# Patient Record
Sex: Female | Born: 1975
Health system: Southern US, Community
[De-identification: ages and names within clinical notes are randomized; demographics above are authoritative.]

## PROBLEM LIST (undated history)

## (undated) DIAGNOSIS — J039 Acute tonsillitis, unspecified: Secondary | ICD-10-CM

## (undated) DIAGNOSIS — R011 Cardiac murmur, unspecified: Secondary | ICD-10-CM

## (undated) DIAGNOSIS — E119 Type 2 diabetes mellitus without complications: Secondary | ICD-10-CM

## (undated) DIAGNOSIS — J45909 Unspecified asthma, uncomplicated: Secondary | ICD-10-CM

## (undated) DIAGNOSIS — G43909 Migraine, unspecified, not intractable, without status migrainosus: Secondary | ICD-10-CM

## (undated) DIAGNOSIS — D219 Benign neoplasm of connective and other soft tissue, unspecified: Secondary | ICD-10-CM

## (undated) HISTORY — PX: OTHER SURGICAL HISTORY: SHX169

---

## 2006-05-20 ENCOUNTER — Emergency Department (HOSPITAL_COMMUNITY): Admission: EM | Admit: 2006-05-20 | Discharge: 2006-05-20 | Payer: Self-pay | Admitting: Emergency Medicine

## 2009-01-04 ENCOUNTER — Emergency Department (HOSPITAL_BASED_OUTPATIENT_CLINIC_OR_DEPARTMENT_OTHER): Admission: EM | Admit: 2009-01-04 | Discharge: 2009-01-05 | Payer: Self-pay | Admitting: Emergency Medicine

## 2009-09-03 ENCOUNTER — Emergency Department (HOSPITAL_BASED_OUTPATIENT_CLINIC_OR_DEPARTMENT_OTHER): Admission: EM | Admit: 2009-09-03 | Discharge: 2009-09-03 | Payer: Self-pay | Admitting: Emergency Medicine

## 2009-09-03 ENCOUNTER — Ambulatory Visit: Payer: Self-pay | Admitting: Diagnostic Radiology

## 2010-04-01 ENCOUNTER — Emergency Department (HOSPITAL_BASED_OUTPATIENT_CLINIC_OR_DEPARTMENT_OTHER): Admission: EM | Admit: 2010-04-01 | Discharge: 2010-04-01 | Payer: Self-pay | Admitting: Emergency Medicine

## 2010-09-25 ENCOUNTER — Emergency Department (HOSPITAL_BASED_OUTPATIENT_CLINIC_OR_DEPARTMENT_OTHER)
Admission: EM | Admit: 2010-09-25 | Discharge: 2010-09-25 | Payer: Self-pay | Source: Home / Self Care | Admitting: Emergency Medicine

## 2010-09-25 LAB — RAPID STREP SCREEN (MED CTR MEBANE ONLY): Streptococcus, Group A Screen (Direct): NEGATIVE

## 2010-11-15 LAB — CBC
HCT: 30.6 % — ABNORMAL LOW (ref 36.0–46.0)
Hemoglobin: 10.4 g/dL — ABNORMAL LOW (ref 12.0–15.0)
MCHC: 33.9 g/dL (ref 30.0–36.0)
MCV: 84.1 fL (ref 78.0–100.0)
Platelets: 204 10*3/uL (ref 150–400)
RBC: 3.65 MIL/uL — ABNORMAL LOW (ref 3.87–5.11)
RDW: 14.8 % (ref 11.5–15.5)
WBC: 9.1 10*3/uL (ref 4.0–10.5)

## 2010-11-15 LAB — DIFFERENTIAL
Basophils Absolute: 0 10*3/uL (ref 0.0–0.1)
Basophils Relative: 0 % (ref 0–1)
Eosinophils Absolute: 0.1 10*3/uL (ref 0.0–0.7)
Eosinophils Relative: 1 % (ref 0–5)
Lymphocytes Relative: 38 % (ref 12–46)
Lymphs Abs: 3.5 10*3/uL (ref 0.7–4.0)
Monocytes Absolute: 0.7 10*3/uL (ref 0.1–1.0)
Monocytes Relative: 8 % (ref 3–12)
Neutro Abs: 4.8 10*3/uL (ref 1.7–7.7)
Neutrophils Relative %: 53 % (ref 43–77)

## 2010-11-15 LAB — URINALYSIS, ROUTINE W REFLEX MICROSCOPIC
Bilirubin Urine: NEGATIVE
Glucose, UA: NEGATIVE mg/dL
Hgb urine dipstick: NEGATIVE
Ketones, ur: NEGATIVE mg/dL
Nitrite: NEGATIVE
Protein, ur: NEGATIVE mg/dL
Specific Gravity, Urine: 1.026 (ref 1.005–1.030)
Urobilinogen, UA: 0.2 mg/dL (ref 0.0–1.0)
pH: 6.5 (ref 5.0–8.0)

## 2010-11-15 LAB — POCT CARDIAC MARKERS
CKMB, poc: <1 ng/mL — ABNORMAL LOW (ref 1.0–8.0)
CKMB, poc: <1 ng/mL — ABNORMAL LOW (ref 1.0–8.0)
Myoglobin, poc: 44.2 ng/mL (ref 12–200)
Myoglobin, poc: 50.9 ng/mL (ref 12–200)
Troponin i, poc: <0.05 ng/mL (ref 0.00–0.09)
Troponin i, poc: <0.05 ng/mL (ref 0.00–0.09)

## 2010-11-15 LAB — BASIC METABOLIC PANEL
BUN: 11 mg/dL (ref 6–23)
CO2: 28 mEq/L (ref 19–32)
Calcium: 9.2 mg/dL (ref 8.4–10.5)
Chloride: 103 mEq/L (ref 96–112)
Creatinine, Ser: 0.7 mg/dL (ref 0.4–1.2)
GFR calc Af Amer: >60 mL/min (ref >60)
GFR calc non Af Amer: >60 mL/min (ref >60)
Glucose, Bld: 89 mg/dL (ref 70–99)
Potassium: 4 mEq/L (ref 3.5–5.1)
Sodium: 142 mEq/L (ref 135–145)

## 2010-11-15 LAB — D-DIMER, QUANTITATIVE: D-Dimer, Quant: 0.41 ug/mL-FEU (ref 0.00–0.48)

## 2010-11-15 LAB — PREGNANCY, URINE: Preg Test, Ur: NEGATIVE

## 2010-12-08 LAB — URINALYSIS, ROUTINE W REFLEX MICROSCOPIC
Bilirubin Urine: NEGATIVE
Glucose, UA: NEGATIVE mg/dL
Hgb urine dipstick: NEGATIVE
Ketones, ur: 15 mg/dL — AB
Nitrite: NEGATIVE
Protein, ur: NEGATIVE mg/dL
Specific Gravity, Urine: 1.033 — ABNORMAL HIGH (ref 1.005–1.030)
Urobilinogen, UA: 1 mg/dL (ref 0.0–1.0)
pH: 6 (ref 5.0–8.0)

## 2010-12-08 LAB — PREGNANCY, URINE: Preg Test, Ur: NEGATIVE

## 2011-06-27 ENCOUNTER — Encounter: Payer: Self-pay | Admitting: Emergency Medicine

## 2011-06-27 ENCOUNTER — Emergency Department (HOSPITAL_BASED_OUTPATIENT_CLINIC_OR_DEPARTMENT_OTHER)
Admission: EM | Admit: 2011-06-27 | Discharge: 2011-06-27 | Disposition: A | Discharge status: Home / Self Care | Payer: Self-pay | Attending: Emergency Medicine | Admitting: Emergency Medicine

## 2011-06-27 DIAGNOSIS — K2970 Gastritis, unspecified, without bleeding: Secondary | ICD-10-CM | POA: Insufficient documentation

## 2011-06-27 DIAGNOSIS — R1013 Epigastric pain: Secondary | ICD-10-CM | POA: Insufficient documentation

## 2011-06-27 LAB — COMPREHENSIVE METABOLIC PANEL
ALT: 8 U/L (ref 0–35)
AST: 14 U/L (ref 0–37)
Albumin: 4.1 g/dL (ref 3.5–5.2)
Alkaline Phosphatase: 65 U/L (ref 39–117)
BUN: 9 mg/dL (ref 6–23)
CO2: 29 mEq/L (ref 19–32)
Calcium: 9.5 mg/dL (ref 8.4–10.5)
Chloride: 102 mEq/L (ref 96–112)
Creatinine, Ser: 0.7 mg/dL (ref 0.50–1.10)
GFR calc Af Amer: >90 mL/min (ref >90)
GFR calc non Af Amer: >90 mL/min (ref >90)
Glucose, Bld: 91 mg/dL (ref 70–99)
Potassium: 4.1 mEq/L (ref 3.5–5.1)
Sodium: 138 mEq/L (ref 135–145)
Total Bilirubin: 0.4 mg/dL (ref 0.3–1.2)
Total Protein: 7.9 g/dL (ref 6.0–8.3)

## 2011-06-27 LAB — DIFFERENTIAL
Basophils Absolute: 0 10*3/uL (ref 0.0–0.1)
Basophils Relative: 1 % (ref 0–1)
Eosinophils Absolute: 0.1 10*3/uL (ref 0.0–0.7)
Eosinophils Relative: 1 % (ref 0–5)
Lymphocytes Relative: 42 % (ref 12–46)
Lymphs Abs: 1.8 10*3/uL (ref 0.7–4.0)
Monocytes Absolute: 0.5 10*3/uL (ref 0.1–1.0)
Monocytes Relative: 11 % (ref 3–12)
Neutro Abs: 2 10*3/uL (ref 1.7–7.7)
Neutrophils Relative %: 46 % (ref 43–77)

## 2011-06-27 LAB — PREGNANCY, URINE: Preg Test, Ur: NEGATIVE

## 2011-06-27 LAB — URINALYSIS, ROUTINE W REFLEX MICROSCOPIC
Bilirubin Urine: NEGATIVE
Glucose, UA: NEGATIVE mg/dL
Hgb urine dipstick: NEGATIVE
Ketones, ur: 15 mg/dL — AB
Nitrite: NEGATIVE
Protein, ur: NEGATIVE mg/dL
Specific Gravity, Urine: 1.018 (ref 1.005–1.030)
Urobilinogen, UA: 0.2 mg/dL (ref 0.0–1.0)
pH: 5.5 (ref 5.0–8.0)

## 2011-06-27 LAB — URINE MICROSCOPIC-ADD ON

## 2011-06-27 LAB — CBC
HCT: 32 % — ABNORMAL LOW (ref 36.0–46.0)
Hemoglobin: 10.3 g/dL — ABNORMAL LOW (ref 12.0–15.0)
MCH: 27.8 pg (ref 26.0–34.0)
MCHC: 32.2 g/dL (ref 30.0–36.0)
MCV: 86.3 fL (ref 78.0–100.0)
Platelets: 283 10*3/uL (ref 150–400)
RBC: 3.71 MIL/uL — ABNORMAL LOW (ref 3.87–5.11)
RDW: 13.4 % (ref 11.5–15.5)
WBC: 4.3 10*3/uL (ref 4.0–10.5)

## 2011-06-27 LAB — LIPASE, BLOOD: Lipase: 36 U/L (ref 11–59)

## 2011-06-27 MED ORDER — GI COCKTAIL ~~LOC~~
30.0000 mL | Freq: Once | ORAL | Status: AC
  Administered 2011-06-27: 30 mL via ORAL
  Filled 2011-06-27: qty 30

## 2011-06-27 MED ORDER — RANITIDINE HCL 150 MG PO TABS: 150.0000 mg | ORAL_TABLET | Freq: Two times a day (BID) | ORAL | Status: DC

## 2011-06-27 NOTE — ED Provider Notes (Signed)
History     CSN: [id number] Arrival date & time: 06/27/2011  9:45 AM   First MD Initiated Contact with Patient 06/27/11 [phone]      Chief Complaint  Patient presents with  . Abdominal Pain    (Consider location/radiation/quality/duration/timing/severity/associated sxs/prior treatment) HPI Patient presents with complaint of epigastric pain. She states pain began 2 days ago and has been constant as well as waxing and waning in nature. She describes the pain as sharp and cramping. She had one episode of emesis the day before the pain began. Has had decreased appetite and decreased by mouth intake due to pain. No diarrhea no further vomiting. No fevers or chills. She has had a history of a stomach ulcer but states this was so long ago that she does not remember if it feels similar to her current pain. Nothing makes the pain worse or better. There are no other associated symptoms.  Past Medical History  Diagnosis Date  . Gastric ulcer    Hx of gastric ulcer per patient  History reviewed. No pertinent past surgical history.  History reviewed. No pertinent family history.  History  Substance Use Topics  . Smoking status: Never Smoker   . Smokeless tobacco: Not on file  . Alcohol Use: Yes     occasional    OB History    Grav Para Term Preterm Abortions TAB SAB Ect Mult Living                  Review of Systems ROS reviewed and otherwise negative except for mentioned in HPI  Allergies  Review of patient's allergies indicates no known allergies.  Home Medications   Current Outpatient Rx  Name Route Sig Dispense Refill  . RANITIDINE HCL 150 MG PO TABS Oral Take 1 tablet (150 mg total) by mouth 2 (two) times daily. 60 tablet 0    BP 114/72  Pulse 62  Temp(Src) 98.6 F (37 C) (Oral)  Resp 16  Ht 5\' 7"  (1.702 m)  Wt 220 lb (99.791 kg)  BMI 34.46 kg/m2  SpO2 99%  LMP 06/20/2011 Vitals reviewed Physical Exam Physical Examination: General appearance - alert, well  appearing, and in no distress Mental status - alert, oriented to person, place, and time Mouth - mucous membranes moist, pharynx normal without lesions Chest - clear to auscultation, no wheezes, rales or rhonchi, symmetric air entry Heart - normal rate, regular rhythm, normal S1, S2, no murmurs, rubs, clicks or gallops Abdomen - ttp in epigastric region, soft, ND, NABS, no gaurding, no rebound Neurological - motor and sensory grossly normal bilaterally Musculoskeletal - no joint tenderness, deformity or swelling Extremities - peripheral pulses normal, no pedal edema, no clubbing or cyanosis Skin - normal coloration and turgor, no rashes, no suspicious skin lesions noted  ED Course  Procedures (including critical care time) Labs, urine ordered.  Will try GI cocktail for discomfort and reassess.  Labs Reviewed  URINALYSIS, ROUTINE W REFLEX MICROSCOPIC - Abnormal; Notable for the following:    Appearance CLOUDY (*)    Ketones, ur 15 (*)    Leukocytes, UA TRACE (*)    All other components within normal limits  CBC - Abnormal; Notable for the following:    RBC 3.71 (*)    Hemoglobin 10.3 (*)    HCT 32.0 (*)    All other components within normal limits  URINE MICROSCOPIC-ADD ON - Abnormal; Notable for the following:    Squamous Epithelial / LPF FEW (*)    Bacteria,  UA MANY (*)    All other components within normal limits  PREGNANCY, URINE  DIFFERENTIAL  COMPREHENSIVE METABOLIC PANEL  LIPASE, BLOOD  URINE CULTURE   No results found. 11:45 AM Labs results were reassuring.  Urine shows many bacteria, but patient has no symtpoms of UTI, so urine culture obtained.    1. Gastritis       MDM  Patient presenting with epigastric pain. Blood work was reassuring and did not reveal any concerning findings to indicate gallbladder pathology or pancreatitis et Karie Soda. Urine did show many bacteria however patient does not have any symptoms of urinary tract infections a urine culture was sent.  Pain was improved after GI cocktail. I suspect pain is related to gastritis or reflux. Will start patient on Zantac and she was advised to follow up with her primary doctor. She is given strict return precautions and she is agreeable with this plan.        Ethelda Chick, MD 06/27/11 [phone]

## 2011-06-27 NOTE — ED Notes (Signed)
Upper abdominal/epigastric pain x 2 days.  Some nausea and vomiting x 1 prior to pain.  No known fever.  No diarrhea.  Some heartburn.

## 2011-06-29 LAB — URINE CULTURE
Colony Count: NO GROWTH
Culture  Setup Time: 201210290323
Culture: NO GROWTH

## 2012-08-01 ENCOUNTER — Encounter (HOSPITAL_BASED_OUTPATIENT_CLINIC_OR_DEPARTMENT_OTHER): Payer: Self-pay

## 2012-08-01 ENCOUNTER — Emergency Department (HOSPITAL_BASED_OUTPATIENT_CLINIC_OR_DEPARTMENT_OTHER)
Admission: EM | Admit: 2012-08-01 | Discharge: 2012-08-01 | Disposition: A | Discharge status: Home / Self Care | Payer: Self-pay | Attending: Emergency Medicine | Admitting: Emergency Medicine

## 2012-08-01 DIAGNOSIS — R1110 Vomiting, unspecified: Secondary | ICD-10-CM | POA: Insufficient documentation

## 2012-08-01 DIAGNOSIS — R51 Headache: Secondary | ICD-10-CM | POA: Insufficient documentation

## 2012-08-01 DIAGNOSIS — Z79899 Other long term (current) drug therapy: Secondary | ICD-10-CM | POA: Insufficient documentation

## 2012-08-01 DIAGNOSIS — Z8679 Personal history of other diseases of the circulatory system: Secondary | ICD-10-CM | POA: Insufficient documentation

## 2012-08-01 DIAGNOSIS — K219 Gastro-esophageal reflux disease without esophagitis: Secondary | ICD-10-CM | POA: Insufficient documentation

## 2012-08-01 HISTORY — DX: Migraine, unspecified, not intractable, without status migrainosus: G43.909

## 2012-08-01 MED ORDER — DIPHENHYDRAMINE HCL 50 MG/ML IJ SOLN
25.0000 mg | Freq: Once | INTRAMUSCULAR | Status: AC
  Administered 2012-08-01: 25 mg via INTRAMUSCULAR
  Filled 2012-08-01: qty 1

## 2012-08-01 MED ORDER — BUTALBITAL-APAP-CAFFEINE 50-325-40 MG PO TABS: 1.0000 | ORAL_TABLET | Freq: Four times a day (QID) | ORAL | Status: AC | PRN

## 2012-08-01 MED ORDER — METOCLOPRAMIDE HCL 5 MG/ML IJ SOLN
10.0000 mg | Freq: Once | INTRAMUSCULAR | Status: AC
  Administered 2012-08-01: 10 mg via INTRAMUSCULAR
  Filled 2012-08-01: qty 2

## 2012-08-01 MED ORDER — KETOROLAC TROMETHAMINE 60 MG/2ML IM SOLN
60.0000 mg | Freq: Once | INTRAMUSCULAR | Status: AC
  Administered 2012-08-01: 60 mg via INTRAMUSCULAR
  Filled 2012-08-01: qty 2

## 2012-08-01 NOTE — ED Notes (Signed)
Karen Sofia, PA-C at bedside 

## 2012-08-01 NOTE — ED Provider Notes (Signed)
History     CSN: [id number]  Arrival date & time 08/01/12  1237   First MD Initiated Contact with Patient 08/01/12 1247      Chief Complaint  Patient presents with  . Headache  . Chest Pain    (Consider location/radiation/quality/duration/timing/severity/associated sxs/prior treatment) Patient is a 36 y.o. female presenting with headaches. The history is provided by the patient. No language interpreter was used.  Headache  This is a new problem. The current episode started yesterday. The problem occurs constantly. The problem has been gradually worsening. The headache is associated with nothing. The pain is located in the frontal region. The quality of the pain is described as sharp. The pain is at a severity of 5/10. The pain is moderate. The pain does not radiate. Associated symptoms include vomiting. She has tried nothing for the symptoms. The treatment provided no relief.   Pt complains of a headache since yesterday.  Pt also worried about her blood pressure Past Medical History  Diagnosis Date  . Gastric ulcer   . Migraines     History reviewed. No pertinent past surgical history.  No family history on file.  History  Substance Use Topics  . Smoking status: Never Smoker   . Smokeless tobacco: Not on file  . Alcohol Use: No    OB History    Grav Para Term Preterm Abortions TAB SAB Ect Mult Living                  Review of Systems  Gastrointestinal: Positive for vomiting.  Neurological: Positive for headaches.  All other systems reviewed and are negative.    Allergies  Review of patient's allergies indicates no known allergies.  Home Medications   Current Outpatient Rx  Name  Route  Sig  Dispense  Refill  . RANITIDINE HCL 150 MG PO TABS   Oral   Take 1 tablet (150 mg total) by mouth 2 (two) times daily.   60 tablet   0     BP 139/89  Pulse 71  Temp 98.7 F (37.1 C) (Oral)  Resp 16  Ht 5\' 7"  (1.702 m)  Wt 205 lb (92.987 kg)  BMI 32.11 kg/m2   SpO2 99%  Physical Exam  Constitutional: She is oriented to person, place, and time. She appears well-developed and well-nourished.  HENT:  Head: Normocephalic and atraumatic.  Right Ear: External ear normal.  Left Ear: External ear normal.  Nose: Nose normal.  Mouth/Throat: Oropharynx is clear and moist.  Eyes: Conjunctivae normal and EOM are normal. Pupils are equal, round, and reactive to light.  Neck: Normal range of motion.  Cardiovascular: Normal rate and normal heart sounds.   Pulmonary/Chest: Effort normal and breath sounds normal.  Abdominal: Soft. Bowel sounds are normal.  Musculoskeletal: Normal range of motion.  Neurological: She is alert and oriented to person, place, and time. She has normal reflexes.  Skin: Skin is warm and dry.  Psychiatric: She has a normal mood and affect.    ED Course  Procedures (including critical care time)  Labs Reviewed - No data to display No results found.   No diagnosis found.    MDM  Pt given torodol, benadryl and reglan.   Pt reports she feels better.   Pt advised to have monitor blood pressure       Lonia Skinner [place], [place] 08/01/12 1432

## 2012-08-01 NOTE — ED Notes (Signed)
Pt reports onset of chest tightness last night while at rest.  She also reports a headache.

## 2012-08-04 NOTE — ED Provider Notes (Signed)
Medical screening examination/treatment/procedure(s) were performed by non-physician practitioner and as supervising physician I was immediately available for consultation/collaboration.   Suzi Roots, MD 08/04/12 [phone]

## 2013-07-11 ENCOUNTER — Ambulatory Visit: Payer: Self-pay | Admitting: Internal Medicine

## 2013-09-19 ENCOUNTER — Ambulatory Visit: Payer: Self-pay | Admitting: Internal Medicine

## 2013-10-24 ENCOUNTER — Ambulatory Visit: Payer: Self-pay | Admitting: Internal Medicine

## 2013-12-17 ENCOUNTER — Ambulatory Visit: Payer: Self-pay | Admitting: Internal Medicine

## 2014-02-10 ENCOUNTER — Emergency Department (HOSPITAL_BASED_OUTPATIENT_CLINIC_OR_DEPARTMENT_OTHER): Payer: Self-pay

## 2014-02-10 ENCOUNTER — Emergency Department (HOSPITAL_BASED_OUTPATIENT_CLINIC_OR_DEPARTMENT_OTHER)
Admission: EM | Admit: 2014-02-10 | Discharge: 2014-02-11 | Disposition: A | Discharge status: Home / Self Care | Payer: Self-pay | Attending: Emergency Medicine | Admitting: Emergency Medicine

## 2014-02-10 ENCOUNTER — Encounter (HOSPITAL_BASED_OUTPATIENT_CLINIC_OR_DEPARTMENT_OTHER): Payer: Self-pay | Admitting: Emergency Medicine

## 2014-02-10 DIAGNOSIS — R102 Pelvic and perineal pain: Secondary | ICD-10-CM

## 2014-02-10 DIAGNOSIS — Z3202 Encounter for pregnancy test, result negative: Secondary | ICD-10-CM | POA: Insufficient documentation

## 2014-02-10 DIAGNOSIS — Z79899 Other long term (current) drug therapy: Secondary | ICD-10-CM | POA: Insufficient documentation

## 2014-02-10 DIAGNOSIS — Z8742 Personal history of other diseases of the female genital tract: Secondary | ICD-10-CM | POA: Insufficient documentation

## 2014-02-10 DIAGNOSIS — K259 Gastric ulcer, unspecified as acute or chronic, without hemorrhage or perforation: Secondary | ICD-10-CM | POA: Insufficient documentation

## 2014-02-10 DIAGNOSIS — N83209 Unspecified ovarian cyst, unspecified side: Secondary | ICD-10-CM | POA: Insufficient documentation

## 2014-02-10 DIAGNOSIS — Z8679 Personal history of other diseases of the circulatory system: Secondary | ICD-10-CM | POA: Insufficient documentation

## 2014-02-10 HISTORY — DX: Benign neoplasm of connective and other soft tissue, unspecified: D21.9

## 2014-02-10 LAB — CBC WITH DIFFERENTIAL/PLATELET
Basophils Absolute: 0 10*3/uL (ref 0.0–0.1)
Basophils Relative: 0 % (ref 0–1)
Eosinophils Absolute: 0.1 10*3/uL (ref 0.0–0.7)
Eosinophils Relative: 2 % (ref 0–5)
HCT: 30.9 % — ABNORMAL LOW (ref 36.0–46.0)
Hemoglobin: 9.8 g/dL — ABNORMAL LOW (ref 12.0–15.0)
Lymphocytes Relative: 33 % (ref 12–46)
Lymphs Abs: 2.9 10*3/uL (ref 0.7–4.0)
MCH: 25.7 pg — ABNORMAL LOW (ref 26.0–34.0)
MCHC: 31.7 g/dL (ref 30.0–36.0)
MCV: 81.1 fL (ref 78.0–100.0)
Monocytes Absolute: 0.6 10*3/uL (ref 0.1–1.0)
Monocytes Relative: 7 % (ref 3–12)
Neutro Abs: 5.3 10*3/uL (ref 1.7–7.7)
Neutrophils Relative %: 59 % (ref 43–77)
Platelets: 332 10*3/uL (ref 150–400)
RBC: 3.81 MIL/uL — ABNORMAL LOW (ref 3.87–5.11)
RDW: 15.8 % — ABNORMAL HIGH (ref 11.5–15.5)
WBC: 8.9 10*3/uL (ref 4.0–10.5)

## 2014-02-10 LAB — COMPREHENSIVE METABOLIC PANEL
ALT: 9 U/L (ref 0–35)
AST: 15 U/L (ref 0–37)
Albumin: 3.8 g/dL (ref 3.5–5.2)
Alkaline Phosphatase: 61 U/L (ref 39–117)
BUN: 12 mg/dL (ref 6–23)
CO2: 27 mEq/L (ref 19–32)
Calcium: 9.6 mg/dL (ref 8.4–10.5)
Chloride: 100 mEq/L (ref 96–112)
Creatinine, Ser: 0.9 mg/dL (ref 0.50–1.10)
GFR calc Af Amer: >90 mL/min (ref >90)
GFR calc non Af Amer: 81 mL/min — ABNORMAL LOW (ref >90)
Glucose, Bld: 106 mg/dL — ABNORMAL HIGH (ref 70–99)
Potassium: 4.5 mEq/L (ref 3.7–5.3)
Sodium: 138 mEq/L (ref 137–147)
Total Bilirubin: <0.2 mg/dL — ABNORMAL LOW (ref 0.3–1.2)
Total Protein: 7.9 g/dL (ref 6.0–8.3)

## 2014-02-10 LAB — WET PREP, GENITAL
Clue Cells Wet Prep HPF POC: NONE SEEN
Trich, Wet Prep: NONE SEEN
Yeast Wet Prep HPF POC: NONE SEEN

## 2014-02-10 LAB — PREGNANCY, URINE: Preg Test, Ur: NEGATIVE

## 2014-02-10 LAB — URINALYSIS, ROUTINE W REFLEX MICROSCOPIC
Bilirubin Urine: NEGATIVE
Glucose, UA: NEGATIVE mg/dL
Hgb urine dipstick: NEGATIVE
Ketones, ur: NEGATIVE mg/dL
Leukocytes, UA: NEGATIVE
Nitrite: NEGATIVE
Protein, ur: NEGATIVE mg/dL
Specific Gravity, Urine: 1.023 (ref 1.005–1.030)
Urobilinogen, UA: 0.2 mg/dL (ref 0.0–1.0)
pH: 6 (ref 5.0–8.0)

## 2014-02-10 MED ORDER — NAPROXEN 500 MG PO TABS: 500.0000 mg | ORAL_TABLET | Freq: Two times a day (BID) | ORAL | Status: DC

## 2014-02-10 MED ORDER — KETOROLAC TROMETHAMINE 30 MG/ML IJ SOLN
30.0000 mg | Freq: Once | INTRAMUSCULAR | Status: AC
  Administered 2014-02-10: 30 mg via INTRAVENOUS
  Filled 2014-02-10: qty 1

## 2014-02-10 MED ORDER — SODIUM CHLORIDE 0.9 % IV SOLN
INTRAVENOUS | Status: DC
  Administered 2014-02-10: 22:00:00 via INTRAVENOUS

## 2014-02-10 NOTE — ED Provider Notes (Signed)
CSN: [id number]     Arrival date & time 02/10/14  1915 History   First MD Initiated Contact with Patient 02/10/14 2108     Chief Complaint  Patient presents with  . Abdominal Pain     (Consider location/radiation/quality/duration/timing/severity/associated sxs/prior Treatment) Patient is a 38 y.o. female presenting with abdominal pain. The history is provided by the patient.  Abdominal Pain Pain location:  RLQ, suprapubic and RUQ Pain quality: aching   Pain radiates to:  Does not radiate Pain severity:  Moderate Onset quality:  Gradual Duration:  4 days Timing:  Intermittent Progression:  Unchanged Chronicity:  New Relieved by:  None tried Worsened by:  Nothing tried Ineffective treatments:  None tried  Abigail Jacobson is a 38 y.o. female who presents to the ED with pelvic pain that started 4 days ago. Her LMP was one month ago. She has not been sexually active in 6 months. Her last pap smear was one year ago and was normal.  She has taken nothing for pain. She is not taking any birth control. The pain goes across the lower abdomen and is worse on the right side. She denies nausea, vomiting or diarrhea.   Past Medical History  Diagnosis Date  . Gastric ulcer   . Migraines   . Fibroid    History reviewed. No pertinent past surgical history. History reviewed. No pertinent family history. History  Substance Use Topics  . Smoking status: Never Smoker   . Smokeless tobacco: Not on file  . Alcohol Use: No   OB History   Grav Para Term Preterm Abortions TAB SAB Ect Mult Living                 Review of Systems  Genitourinary: Positive for pelvic pain.  All other systems negative    Allergies  Review of patient's allergies indicates no known allergies.  Home Medications   Prior to Admission medications   Medication Sig Start Date End Date Taking? Authorizing Provider  ranitidine (ZANTAC) 150 MG tablet Take 1 tablet (150 mg total) by mouth 2 (two) times daily. 06/27/11  06/26/12  Threasa Beards, MD   BP 158/76  Pulse 70  Temp(Src) 99.1 F (37.3 C) (Oral)  Resp 20  SpO2 99%  LMP 01/10/2014 Physical Exam  Nursing note and vitals reviewed. Constitutional: She is oriented to person, place, and time. She appears well-developed and well-nourished.  HENT:  Head: Normocephalic.  Eyes: EOM are normal.  Neck: Neck supple.  Cardiovascular: Normal rate and regular rhythm.   Pulmonary/Chest: Effort normal and breath sounds normal.  Abdominal: Soft. There is tenderness in the right lower quadrant, suprapubic area and left lower quadrant. There is no rebound, no guarding and no CVA tenderness.  Pain is worse with palpation in the RLQ  Genitourinary:  External genitalia without lesions, white discharge vaginal vault, mild CMT, right adnexal tenderness with fullness palpated.   Musculoskeletal: Normal range of motion.  Neurological: She is alert and oriented to person, place, and time. No cranial nerve deficit.  Skin: Skin is warm and dry.  Psychiatric: She has a normal mood and affect. Her behavior is normal.    ED Course  Procedures Results for orders placed during the hospital encounter of 02/10/14 (from the past 24 hour(s))  URINALYSIS, ROUTINE W REFLEX MICROSCOPIC     Status: None   Collection Time    02/10/14  8:57 PM      Result Value Ref Range   Color, Urine YELLOW  YELLOW   APPearance CLEAR  CLEAR   Specific Gravity, Urine 1.023  1.005 - 1.030   pH 6.0  5.0 - 8.0   Glucose, UA NEGATIVE  NEGATIVE mg/dL   Hgb urine dipstick NEGATIVE  NEGATIVE   Bilirubin Urine NEGATIVE  NEGATIVE   Ketones, ur NEGATIVE  NEGATIVE mg/dL   Protein, ur NEGATIVE  NEGATIVE mg/dL   Urobilinogen, UA 0.2  0.0 - 1.0 mg/dL   Nitrite NEGATIVE  NEGATIVE   Leukocytes, UA NEGATIVE  NEGATIVE  PREGNANCY, URINE     Status: None   Collection Time    02/10/14  8:57 PM      Result Value Ref Range   Preg Test, Ur NEGATIVE  NEGATIVE  CBC WITH DIFFERENTIAL     Status: Abnormal    Collection Time    02/10/14  9:30 PM      Result Value Ref Range   WBC 8.9  4.0 - 10.5 K/uL   RBC 3.81 (*) 3.87 - 5.11 MIL/uL   Hemoglobin 9.8 (*) 12.0 - 15.0 g/dL   HCT 30.9 (*) 36.0 - 46.0 %   MCV 81.1  78.0 - 100.0 fL   MCH 25.7 (*) 26.0 - 34.0 pg   MCHC 31.7  30.0 - 36.0 g/dL   RDW 15.8 (*) 11.5 - 15.5 %   Platelets 332  150 - 400 K/uL   Neutrophils Relative % 59  43 - 77 %   Neutro Abs 5.3  1.7 - 7.7 K/uL   Lymphocytes Relative 33  12 - 46 %   Lymphs Abs 2.9  0.7 - 4.0 K/uL   Monocytes Relative 7  3 - 12 %   Monocytes Absolute 0.6  0.1 - 1.0 K/uL   Eosinophils Relative 2  0 - 5 %   Eosinophils Absolute 0.1  0.0 - 0.7 K/uL   Basophils Relative 0  0 - 1 %   Basophils Absolute 0.0  0.0 - 0.1 K/uL  COMPREHENSIVE METABOLIC PANEL     Status: Abnormal   Collection Time    02/10/14  9:30 PM      Result Value Ref Range   Sodium 138  137 - 147 mEq/L   Potassium 4.5  3.7 - 5.3 mEq/L   Chloride 100  96 - 112 mEq/L   CO2 27  19 - 32 mEq/L   Glucose, Bld 106 (*) 70 - 99 mg/dL   BUN 12  6 - 23 mg/dL   Creatinine, Ser 0.90  0.50 - 1.10 mg/dL   Calcium 9.6  8.4 - 10.5 mg/dL   Total Protein 7.9  6.0 - 8.3 g/dL   Albumin 3.8  3.5 - 5.2 g/dL   AST 15  0 - 37 U/L   ALT 9  0 - 35 U/L   Alkaline Phosphatase 61  39 - 117 U/L   Total Bilirubin <0.2 (*) 0.3 - 1.2 mg/dL   GFR calc non Af Amer 81 (*) >90 mL/min   GFR calc Af Amer >90  >90 mL/min  WET PREP, GENITAL     Status: Abnormal   Collection Time    02/10/14 10:15 PM      Result Value Ref Range   Yeast Wet Prep HPF POC NONE SEEN  NONE SEEN   Trich, Wet Prep NONE SEEN  NONE SEEN   Clue Cells Wet Prep HPF POC NONE SEEN  NONE SEEN   WBC, Wet Prep HPF POC FEW (*) NONE SEEN    US Transvaginal Non-ob  02/10/2014   CLINICAL DATA:  Right lower quadrant abdominal pain.  EXAM: TRANSABDOMINAL AND TRANSVAGINAL ULTRASOUND OF PELVIS  TECHNIQUE: Both transabdominal and transvaginal ultrasound examinations of the pelvis were performed.  Transabdominal technique was performed for global imaging of the pelvis including uterus, ovaries, adnexal regions, and pelvic cul-de-sac. It was necessary to proceed with endovaginal exam following the transabdominal exam to visualize the uterus and ovaries in greater detail.  COMPARISON:  None  FINDINGS: Uterus  Measurements: 5.6 x 6.3 x 7.4 cm. No fibroids or other mass visualized.  Endometrium  Thickness: 2.6 cm.  Diffusely thickened, of uncertain significance.  Right ovary  Measurements: 6.1 x 3.9 x 4.4 cm. There is a 5.3 x 4.5 x 3.9 cm collection at the right ovary, with lace-like structure and internal echoes, compatible with a hemorrhagic cyst.  Left ovary  Measurements: 3.5 x 1.7 x 1.5 cm. Normal appearance/no adnexal mass.  Other findings  There is a complex collection of fluid at the cervix, with a simple cyst component, an apparent hemorrhagic component, and question of a small soft tissue nodule. This measures approximately 3.0 x 2.5 x 1.8 cm.  A small amount of free fluid is seen within the pelvic cul-de-sac.  IMPRESSION: 1. 5.3 cm hemorrhagic cyst noted at the right ovary. 2. Complex collection of fluid at the cervix, with a hemorrhagic component, a simple cyst component, and question of a small soft tissue nodule. This measures approximately 3.0 x 2.5 x 1.8 cm. Further evaluation of the cervix would be helpful, as deemed clinically appropriate. 3. Diffuse endometrial echo complex thickening, of uncertain significance. This may reflect some degree of obstruction from the collection at the cervix. 4. No evidence for ovarian torsion.   Electronically Signed   By: Garald Balding M.D.   On: 02/10/2014 23:23   US Pelvis Complete  02/10/2014   CLINICAL DATA:  Right lower quadrant abdominal pain.  EXAM: TRANSABDOMINAL AND TRANSVAGINAL ULTRASOUND OF PELVIS  TECHNIQUE: Both transabdominal and transvaginal ultrasound examinations of the pelvis were performed. Transabdominal technique was performed for global  imaging of the pelvis including uterus, ovaries, adnexal regions, and pelvic cul-de-sac. It was necessary to proceed with endovaginal exam following the transabdominal exam to visualize the uterus and ovaries in greater detail.  COMPARISON:  None  FINDINGS: Uterus  Measurements: 5.6 x 6.3 x 7.4 cm. No fibroids or other mass visualized.  Endometrium  Thickness: 2.6 cm.  Diffusely thickened, of uncertain significance.  Right ovary  Measurements: 6.1 x 3.9 x 4.4 cm. There is a 5.3 x 4.5 x 3.9 cm collection at the right ovary, with lace-like structure and internal echoes, compatible with a hemorrhagic cyst.  Left ovary  Measurements: 3.5 x 1.7 x 1.5 cm. Normal appearance/no adnexal mass.  Other findings  There is a complex collection of fluid at the cervix, with a simple cyst component, an apparent hemorrhagic component, and question of a small soft tissue nodule. This measures approximately 3.0 x 2.5 x 1.8 cm.  A small amount of free fluid is seen within the pelvic cul-de-sac.  IMPRESSION: 1. 5.3 cm hemorrhagic cyst noted at the right ovary. 2. Complex collection of fluid at the cervix, with a hemorrhagic component, a simple cyst component, and question of a small soft tissue nodule. This measures approximately 3.0 x 2.5 x 1.8 cm. Further evaluation of the cervix would be helpful, as deemed clinically appropriate. 3. Diffuse endometrial echo complex thickening, of uncertain significance. This may reflect some degree of obstruction from the  collection at the cervix. 4. No evidence for ovarian torsion.   Electronically Signed   By: Garald Balding M.D.   On: 02/10/2014 23:23    Toradol 60 mg IM given and patient had relief of pain   MDM  38 y.o. female with pelvic pain x 4 days. Stable for discharge without concern for appendicitis, obstruction, torsion or ectopic pregnancy. She will [facility] up at the [facility] to be sure the cyst resolves. I have reviewed this patient's vital signs, nurses notes,  appropriate labs and imaging.  I have discussed findings and plan of care with the patient and she voices understanding and agrees with plan.    Medication List    TAKE these medications       naproxen 500 MG tablet  Commonly known as:  NAPROSYN  Take 1 tablet (500 mg total) by mouth 2 (two) times daily.      ASK your doctor about these medications       ranitidine 150 MG tablet  Commonly known as:  ZANTAC  Take 1 tablet (150 mg total) by mouth 2 (two) times daily.           [facility] Bunnie Pion, [place] 02/11/14 [phone]

## 2014-02-10 NOTE — Discharge Instructions (Signed)
Your ultrasound tonight shows that you have a cyst on the right ovary. You will need to follow up with the GYN doctor to be sure the cyst resolves. Take the medication as directed.   Ovarian Cyst An ovarian cyst is a fluid-filled sac that forms on an ovary. The ovaries are small organs that produce eggs in women. Various types of cysts can form on the ovaries. Most are not cancerous. Many do not cause problems, and they often go away on their own. Some may cause symptoms and require treatment. Common types of ovarian cysts include:  Functional cysts These cysts may occur every month during the menstrual cycle. This is normal. The cysts usually go away with the next menstrual cycle if the woman does not get pregnant. Usually, there are no symptoms with a functional cyst.  Endometrioma cysts These cysts form from the tissue that lines the uterus. They are also called "chocolate cysts" because they become filled with blood that turns brown. This type of cyst can cause pain in the lower abdomen during intercourse and with your menstrual period.  Cystadenoma cysts This type develops from the cells on the outside of the ovary. These cysts can get very big and cause lower abdomen pain and pain with intercourse. This type of cyst can twist on itself, cut off its blood supply, and cause severe pain. It can also easily rupture and cause a lot of pain.  Dermoid cysts This type of cyst is sometimes found in both ovaries. These cysts may contain different kinds of body tissue, such as skin, teeth, hair, or cartilage. They usually do not cause symptoms unless they get very big.  Theca lutein cysts These cysts occur when too much of a certain hormone (human chorionic gonadotropin) is produced and overstimulates the ovaries to produce an egg. This is most common after procedures used to assist with the conception of a baby (in vitro fertilization). CAUSES   Fertility drugs can cause a condition in which multiple  large cysts are formed on the ovaries. This is called ovarian hyperstimulation syndrome.  A condition called polycystic ovary syndrome can cause hormonal imbalances that can lead to nonfunctional ovarian cysts. SIGNS AND SYMPTOMS  Many ovarian cysts do not cause symptoms. If symptoms are present, they may include:  Pelvic pain or pressure.  Pain in the lower abdomen.  Pain during sexual intercourse.  Increasing girth (swelling) of the abdomen.  Abnormal menstrual periods.  Increasing pain with menstrual periods.  Stopping having menstrual periods without being pregnant. DIAGNOSIS  These cysts are commonly found during a routine or annual pelvic exam. Tests may be ordered to find out more about the cyst. These tests may include:  Ultrasound.  X-ray of the pelvis.  CT scan.  MRI.  Blood tests. TREATMENT  Many ovarian cysts go away on their own without treatment. Your health care provider may want to check your cyst regularly for 2 3 months to see if it changes. For women in menopause, it is particularly important to monitor a cyst closely because of the higher rate of ovarian cancer in menopausal women. When treatment is needed, it may include any of the following:  A procedure to drain the cyst (aspiration). This may be done using a long needle and ultrasound. It can also be done through a laparoscopic procedure. This involves using a thin, lighted tube with a tiny camera on the end (laparoscope) inserted through a small incision.  Surgery to remove the whole cyst. This may  be done using laparoscopic surgery or an open surgery involving a larger incision in the lower abdomen.  Hormone treatment or birth control pills. These methods are sometimes used to help dissolve a cyst. HOME CARE INSTRUCTIONS   Only take over-the-counter or prescription medicines as directed by your health care provider.  Follow up with your health care provider as directed.  Get regular pelvic exams  and Pap tests. SEEK MEDICAL CARE IF:   Your periods are late, irregular, or painful, or they stop.  Your pelvic pain or abdominal pain does not go away.  Your abdomen becomes larger or swollen.  You have pressure on your bladder or trouble emptying your bladder completely.  You have pain during sexual intercourse.  You have feelings of fullness, pressure, or discomfort in your stomach.  You lose weight for no apparent reason.  You feel generally ill.  You become constipated.  You lose your appetite.  You develop acne.  You have an increase in body and facial hair.  You are gaining weight, without changing your exercise and eating habits.  You think you are pregnant. SEEK IMMEDIATE MEDICAL CARE IF:   You have increasing abdominal pain.  You feel sick to your stomach (nauseous), and you throw up (vomit).  You develop a fever that comes on suddenly.  You have abdominal pain during a bowel movement.  Your menstrual periods become heavier than usual. Document Released: 08/16/2005 Document Revised: 06/06/2013 Document Reviewed: 04/23/2013 [facility] Patient Information 2014 [place].

## 2014-02-10 NOTE — ED Notes (Signed)
Intermitant abd pain RLQ since Thursday pass denies N/V/D

## 2014-02-11 LAB — GC/CHLAMYDIA PROBE AMP
CT Probe RNA: NEGATIVE
GC Probe RNA: NEGATIVE

## 2014-02-11 NOTE — ED Provider Notes (Signed)
Medical screening examination/treatment/procedure(s) were performed by non-physician practitioner and as supervising physician I was immediately available for consultation/collaboration.   EKG Interpretation None        Blanchard Kelch, MD 02/11/14 [phone]

## 2014-02-28 ENCOUNTER — Encounter: Payer: Self-pay | Admitting: Obstetrics & Gynecology

## 2014-03-26 ENCOUNTER — Encounter: Payer: Self-pay | Admitting: Obstetrics & Gynecology

## 2014-04-03 ENCOUNTER — Encounter: Payer: Self-pay | Admitting: Obstetrics & Gynecology

## 2014-04-03 ENCOUNTER — Ambulatory Visit (INDEPENDENT_AMBULATORY_CARE_PROVIDER_SITE_OTHER): Payer: Self-pay | Admitting: Obstetrics & Gynecology

## 2014-04-03 VITALS — BP 117/75 | HR 77 | Resp 16 | Ht 67.0 in | Wt 253.0 lb

## 2014-04-03 DIAGNOSIS — N949 Unspecified condition associated with female genital organs and menstrual cycle: Secondary | ICD-10-CM

## 2014-04-03 DIAGNOSIS — N920 Excessive and frequent menstruation with regular cycle: Secondary | ICD-10-CM

## 2014-04-03 DIAGNOSIS — N83209 Unspecified ovarian cyst, unspecified side: Secondary | ICD-10-CM

## 2014-04-03 DIAGNOSIS — N921 Excessive and frequent menstruation with irregular cycle: Secondary | ICD-10-CM

## 2014-04-03 LAB — POCT URINE PREGNANCY: Preg Test, Ur: NEGATIVE

## 2014-04-03 MED ORDER — MEDROXYPROGESTERONE ACETATE 150 MG/ML IM SUSP
150.0000 mg | INTRAMUSCULAR | Status: DC
  Administered 2014-04-03: 150 mg via INTRAMUSCULAR

## 2014-04-04 NOTE — Progress Notes (Signed)
   Subjective:    Patient ID: Abigail Jacobson, female    DOB: 28-Jul-1976, 38 y.o.   MRN: [id number]  Gynecologic Exam The patient's primary symptoms include pelvic pain.    Pt is here for f/u from ED visit for ovarian cyst.    Uterus  Measurements: 5.6 x 6.3 x 7.4 cm. No fibroids or other mass  Visualized.   Endometrium  Thickness: 2.6 cm. Diffusely thickened, of uncertain significance.   Right ovary  Measurements: 6.1 x 3.9 x 4.4 cm. There is a 5.3 x 4.5 x 3.9 cm  collection at the right ovary, with lace-like structure and internal  echoes, compatible with a hemorrhagic cyst.   Left ovary  Measurements: 3.5 x 1.7 x 1.5 cm. Normal appearance/no adnexal mass.   Other findings  There is a complex collection of fluid at the cervix, with a simple  cyst component, an apparent hemorrhagic component, and question of a  small soft tissue nodule. This measures approximately 3.0 x 2.5 x  1.8 cm.  A small amount of free fluid is seen within the pelvic cul-de-sac.   IMPRESSION:  1. 5.3 cm hemorrhagic cyst noted at the right ovary.  2. Complex collection of fluid at the cervix, with a hemorrhagic  component, a simple cyst component, and question of a small soft  tissue nodule. This measures approximately 3.0 x 2.5 x 1.8 cm.  Further evaluation of the cervix would be helpful, as deemed  clinically appropriate.  3. Diffuse endometrial echo complex thickening, of uncertain  significance. This may reflect some degree of obstruction from the  collection at the cervix.  4. No evidence for ovarian torsion.  Explained hemorrhagic cyst and thickened lining.  Pt does not use birth control and has heavy menstrual cycles.  Pt agrees to try depo provera which will thin lining, inhibit ovulation and prevent hemorrhagic cysts, and provide good birth control.  Review of Systems  Constitutional: Negative.   Cardiovascular: Negative.   Gastrointestinal: Negative.   Genitourinary: Positive for menstrual  problem and pelvic pain.  Psychiatric/Behavioral: Negative.        Objective:   Physical Exam  Vitals reviewed. Constitutional: She appears well-developed and well-nourished. No distress.  HENT:  Head: Normocephalic and atraumatic.  Abdominal: Soft. She exhibits no distension and no mass. There is no tenderness. There is no rebound and no guarding.  Genitourinary:  Tanner V Cervix with nabothian cyst on lower lip of cervix (may correspond to cervical cyst on [place] report. Uterus non tender but exam limited by habitus Not able to palpate adnexa due to habitus.          Assessment & Plan:  38 yo female with right ovarian cyst need cycle regulation and birth control 1-Depo provera (neg pregnancy test and no recent sexual encounter--many months) 2-Pt states pap is up to date 3-F/U US in 4 weeks then RTC for results and eval how pt is doing on depo.

## 2014-06-26 ENCOUNTER — Ambulatory Visit: Payer: Self-pay

## 2014-06-27 ENCOUNTER — Emergency Department (HOSPITAL_BASED_OUTPATIENT_CLINIC_OR_DEPARTMENT_OTHER)
Admission: EM | Admit: 2014-06-27 | Discharge: 2014-06-27 | Disposition: A | Discharge status: Home / Self Care | Payer: Self-pay | Attending: Emergency Medicine | Admitting: Emergency Medicine

## 2014-06-27 ENCOUNTER — Encounter (HOSPITAL_BASED_OUTPATIENT_CLINIC_OR_DEPARTMENT_OTHER): Payer: Self-pay | Admitting: Emergency Medicine

## 2014-06-27 DIAGNOSIS — Z8742 Personal history of other diseases of the female genital tract: Secondary | ICD-10-CM | POA: Insufficient documentation

## 2014-06-27 DIAGNOSIS — Z8679 Personal history of other diseases of the circulatory system: Secondary | ICD-10-CM | POA: Insufficient documentation

## 2014-06-27 DIAGNOSIS — K259 Gastric ulcer, unspecified as acute or chronic, without hemorrhage or perforation: Secondary | ICD-10-CM | POA: Insufficient documentation

## 2014-06-27 DIAGNOSIS — Z791 Long term (current) use of non-steroidal anti-inflammatories (NSAID): Secondary | ICD-10-CM | POA: Insufficient documentation

## 2014-06-27 DIAGNOSIS — Z79899 Other long term (current) drug therapy: Secondary | ICD-10-CM | POA: Insufficient documentation

## 2014-06-27 DIAGNOSIS — J029 Acute pharyngitis, unspecified: Secondary | ICD-10-CM | POA: Insufficient documentation

## 2014-06-27 LAB — RAPID STREP SCREEN (MED CTR MEBANE ONLY): Streptococcus, Group A Screen (Direct): NEGATIVE

## 2014-06-27 MED ORDER — PREDNISONE 10 MG PO TABS
20.0000 mg | ORAL_TABLET | Freq: Two times a day (BID) | ORAL | Status: DC
Start: 2014-06-27 — End: 2014-12-26

## 2014-06-27 NOTE — ED Provider Notes (Signed)
CSN: [id number]     Arrival date & time 06/27/14  0913 History   First MD Initiated Contact with Patient 06/27/14 1004     Chief Complaint  Patient presents with  . Sore Throat     (Consider location/radiation/quality/duration/timing/severity/associated sxs/prior Treatment) Patient is a 38 y.o. female presenting with pharyngitis. The history is provided by the patient.  Sore Throat This is a new problem. The current episode started 2 days ago. The problem occurs constantly. The problem has been gradually worsening. Pertinent negatives include no chest pain and no shortness of breath. The symptoms are aggravated by swallowing. Nothing relieves the symptoms. She has tried nothing for the symptoms. The treatment provided no relief.    Past Medical History  Diagnosis Date  . Gastric ulcer   . Migraines   . Fibroid    History reviewed. No pertinent past surgical history. Family History  Problem Relation Age of Onset  . Hypertension Mother   . Stroke Maternal Grandmother   . Stroke Maternal Grandfather    History  Substance Use Topics  . Smoking status: Never Smoker   . Smokeless tobacco: Never Used  . Alcohol Use: Yes     Comment: occassionally   OB History   Grav Para Term Preterm Abortions TAB SAB Ect Mult Living   0 0 0 0 0 0 0 0 0 0      Review of Systems  Respiratory: Negative for shortness of breath.   Cardiovascular: Negative for chest pain.  All other systems reviewed and are negative.     Allergies  Review of patient's allergies indicates no known allergies.  Home Medications   Prior to Admission medications   Medication Sig Start Date End Date Taking? Authorizing Provider  naproxen (NAPROSYN) 500 MG tablet Take 1 tablet (500 mg total) by mouth 2 (two) times daily. 02/10/14   Sidney, NP  ranitidine (ZANTAC) 150 MG tablet Take 1 tablet (150 mg total) by mouth 2 (two) times daily. 06/27/11 06/26/12  Threasa Beards, MD   BP 138/69  Pulse 94   Temp(Src) 98.4 F (36.9 C) (Oral)  Resp 16  Ht 5\' 7"  (1.702 m)  SpO2 97% Physical Exam  Nursing note and vitals reviewed. Constitutional: She is oriented to person, place, and time. She appears well-developed and well-nourished. No distress.  HENT:  Head: Normocephalic and atraumatic.  The posterior oropharynx is erythematous with no tonsillar exudates. Is no stridor.  Neck: Normal range of motion. Neck supple.  Cardiovascular: Normal rate and regular rhythm.  Exam reveals no gallop and no friction rub.   No murmur heard. Pulmonary/Chest: Effort normal and breath sounds normal. No respiratory distress. She has no wheezes.  Abdominal: Soft. Bowel sounds are normal. She exhibits no distension. There is no tenderness.  Musculoskeletal: Normal range of motion.  Lymphadenopathy:    She has no cervical adenopathy.  Neurological: She is alert and oriented to person, place, and time.  Skin: Skin is warm and dry. She is not diaphoretic.    ED Course  Procedures (including critical care time) Labs Review Labs Reviewed  RAPID STREP SCREEN  CULTURE, GROUP A STREP    Imaging Review No results found.   EKG Interpretation None      MDM   Final diagnoses:  None    We'll treat with prednisone as she has had little relief with ibuprofen. Strep test is negative and I do not feel as though an antibiotic is indicated. To return as needed for  any problems.    Veryl Speak, MD 06/27/14 1013

## 2014-06-27 NOTE — ED Notes (Signed)
Patient states she has had a sore throat for the last three days.  Sore throat is associated with a "tickle" in the back of her throat and non productive cough. Denies fever or other symptoms.

## 2014-06-27 NOTE — Discharge Instructions (Signed)
Prednisone as prescribed as needed for pain.  We will call you if your cultures indicate you require further treatment.  Return to the emergency department if you develop and inability to swallow, difficulty breathing, or other new and concerning symptoms.   Pharyngitis Pharyngitis is redness, pain, and swelling (inflammation) of your pharynx.  CAUSES  Pharyngitis is usually caused by infection. Most of the time, these infections are from viruses (viral) and are part of a cold. However, sometimes pharyngitis is caused by bacteria (bacterial). Pharyngitis can also be caused by allergies. Viral pharyngitis may be spread from person to person by coughing, sneezing, and personal items or utensils (cups, forks, spoons, toothbrushes). Bacterial pharyngitis may be spread from person to person by more intimate contact, such as kissing.  SIGNS AND SYMPTOMS  Symptoms of pharyngitis include:   Sore throat.   Tiredness (fatigue).   Low-grade fever.   Headache.  Joint pain and muscle aches.  Skin rashes.  Swollen lymph nodes.  Plaque-like film on throat or tonsils (often seen with bacterial pharyngitis). DIAGNOSIS  Your health care provider will ask you questions about your illness and your symptoms. Your medical history, along with a physical exam, is often all that is needed to diagnose pharyngitis. Sometimes, a rapid strep test is done. Other lab tests may also be done, depending on the suspected cause.  TREATMENT  Viral pharyngitis will usually get better in 3-4 days without the use of medicine. Bacterial pharyngitis is treated with medicines that kill germs (antibiotics).  HOME CARE INSTRUCTIONS   Drink enough water and fluids to keep your urine clear or pale yellow.   Only take over-the-counter or prescription medicines as directed by your health care provider:   If you are prescribed antibiotics, make sure you finish them even if you start to feel better.   Do not take  aspirin.   Get lots of rest.   Gargle with 8 oz of salt water ( tsp of salt per 1 qt of water) as often as every 1-2 hours to soothe your throat.   Throat lozenges (if you are not at risk for choking) or sprays may be used to soothe your throat. SEEK MEDICAL CARE IF:   You have large, tender lumps in your neck.  You have a rash.  You cough up green, yellow-brown, or bloody spit. SEEK IMMEDIATE MEDICAL CARE IF:   Your neck becomes stiff.  You drool or are unable to swallow liquids.  You vomit or are unable to keep medicines or liquids down.  You have severe pain that does not go away with the use of recommended medicines.  You have trouble breathing (not caused by a stuffy nose). MAKE SURE YOU:   Understand these instructions.  Will watch your condition.  Will get help right away if you are not doing well or get worse. Document Released: 08/16/2005 Document Revised: 06/06/2013 Document Reviewed: 04/23/2013 [facility] Patient Information 2015 [place], [place]. This information is not intended to replace advice given to you by your health care provider. Make sure you discuss any questions you have with your health care provider.

## 2014-06-29 LAB — CULTURE, GROUP A STREP

## 2014-07-22 ENCOUNTER — Emergency Department (HOSPITAL_BASED_OUTPATIENT_CLINIC_OR_DEPARTMENT_OTHER)
Admission: EM | Admit: 2014-07-22 | Discharge: 2014-07-22 | Disposition: A | Discharge status: Home / Self Care | Payer: Self-pay | Attending: Emergency Medicine | Admitting: Emergency Medicine

## 2014-07-22 ENCOUNTER — Encounter (HOSPITAL_BASED_OUTPATIENT_CLINIC_OR_DEPARTMENT_OTHER): Payer: Self-pay | Admitting: *Deleted

## 2014-07-22 DIAGNOSIS — Z7952 Long term (current) use of systemic steroids: Secondary | ICD-10-CM | POA: Insufficient documentation

## 2014-07-22 DIAGNOSIS — Z8719 Personal history of other diseases of the digestive system: Secondary | ICD-10-CM | POA: Insufficient documentation

## 2014-07-22 DIAGNOSIS — Z8679 Personal history of other diseases of the circulatory system: Secondary | ICD-10-CM | POA: Insufficient documentation

## 2014-07-22 DIAGNOSIS — Z8742 Personal history of other diseases of the female genital tract: Secondary | ICD-10-CM | POA: Insufficient documentation

## 2014-07-22 DIAGNOSIS — J039 Acute tonsillitis, unspecified: Secondary | ICD-10-CM | POA: Insufficient documentation

## 2014-07-22 DIAGNOSIS — Z791 Long term (current) use of non-steroidal anti-inflammatories (NSAID): Secondary | ICD-10-CM | POA: Insufficient documentation

## 2014-07-22 DIAGNOSIS — Z79899 Other long term (current) drug therapy: Secondary | ICD-10-CM | POA: Insufficient documentation

## 2014-07-22 LAB — CBC WITH DIFFERENTIAL/PLATELET
Basophils Absolute: 0 10*3/uL (ref 0.0–0.1)
Basophils Relative: 0 % (ref 0–1)
Eosinophils Absolute: 0.1 10*3/uL (ref 0.0–0.7)
Eosinophils Relative: 1 % (ref 0–5)
HCT: 30.6 % — ABNORMAL LOW (ref 36.0–46.0)
Hemoglobin: 9.5 g/dL — ABNORMAL LOW (ref 12.0–15.0)
Lymphocytes Relative: 32 % (ref 12–46)
Lymphs Abs: 1.9 10*3/uL (ref 0.7–4.0)
MCH: 24.9 pg — ABNORMAL LOW (ref 26.0–34.0)
MCHC: 31 g/dL (ref 30.0–36.0)
MCV: 80.3 fL (ref 78.0–100.0)
Monocytes Absolute: 0.5 10*3/uL (ref 0.1–1.0)
Monocytes Relative: 8 % (ref 3–12)
Neutro Abs: 3.5 10*3/uL (ref 1.7–7.7)
Neutrophils Relative %: 59 % (ref 43–77)
Platelets: 292 10*3/uL (ref 150–400)
RBC: 3.81 MIL/uL — ABNORMAL LOW (ref 3.87–5.11)
RDW: 17 % — ABNORMAL HIGH (ref 11.5–15.5)
WBC: 6 10*3/uL (ref 4.0–10.5)

## 2014-07-22 LAB — BASIC METABOLIC PANEL
Anion gap: 12 (ref 5–15)
BUN: 10 mg/dL (ref 6–23)
CO2: 26 mEq/L (ref 19–32)
Calcium: 9.3 mg/dL (ref 8.4–10.5)
Chloride: 104 mEq/L (ref 96–112)
Creatinine, Ser: 0.8 mg/dL (ref 0.50–1.10)
GFR calc Af Amer: >90 mL/min (ref >90)
GFR calc non Af Amer: >90 mL/min (ref >90)
Glucose, Bld: 101 mg/dL — ABNORMAL HIGH (ref 70–99)
Potassium: 3.8 mEq/L (ref 3.7–5.3)
Sodium: 142 mEq/L (ref 137–147)

## 2014-07-22 LAB — MONONUCLEOSIS SCREEN: Mono Screen: NEGATIVE

## 2014-07-22 MED ORDER — PENICILLIN V POTASSIUM 500 MG PO TABS: 500.0000 mg | ORAL_TABLET | Freq: Four times a day (QID) | ORAL | Status: AC

## 2014-07-22 NOTE — ED Provider Notes (Signed)
CSN: [id number]     Arrival date & time 07/22/14  1116 History   First MD Initiated Contact with Patient 07/22/14 1142     Chief Complaint  Patient presents with  . Sore Throat     (Consider location/radiation/quality/duration/timing/severity/associated sxs/prior Treatment) Patient is a 38 y.o. female presenting with pharyngitis. The history is provided by the patient. No language interpreter was used.  Sore Throat This is a new problem. The problem occurs constantly. The problem has been unchanged. Associated symptoms include a sore throat and swollen glands. Nothing aggravates the symptoms. She has tried nothing for the symptoms. The treatment provided moderate relief.    Past Medical History  Diagnosis Date  . Gastric ulcer   . Migraines   . Fibroid    History reviewed. No pertinent past surgical history. Family History  Problem Relation Age of Onset  . Hypertension Mother   . Stroke Maternal Grandmother   . Stroke Maternal Grandfather    History  Substance Use Topics  . Smoking status: Never Smoker   . Smokeless tobacco: Never Used  . Alcohol Use: Yes     Comment: occassionally   OB History    Gravida Para Term Preterm AB TAB SAB Ectopic Multiple Living   0 0 0 0 0 0 0 0 0 0      Review of Systems  HENT: Positive for sore throat.   All other systems reviewed and are negative.     Allergies  Review of patient's allergies indicates no known allergies.  Home Medications   Prior to Admission medications   Medication Sig Start Date End Date Taking? Authorizing Provider  naproxen (NAPROSYN) 500 MG tablet Take 1 tablet (500 mg total) by mouth 2 (two) times daily. 02/10/14   Hope Bunnie Pion, NP  predniSONE (DELTASONE) 10 MG tablet Take 2 tablets (20 mg total) by mouth 2 (two) times daily. 06/27/14   Veryl Speak, MD  ranitidine (ZANTAC) 150 MG tablet Take 1 tablet (150 mg total) by mouth 2 (two) times daily. 06/27/11 06/26/12  Threasa Beards, MD   BP 143/79 mmHg   Pulse 98  Temp(Src) 98.3 F (36.8 C) (Oral)  Resp 18  Ht 5\' 7"  (1.702 m)  Wt 253 lb (114.76 kg)  BMI 39.62 kg/m2  SpO2 98%  LMP 07/20/2014 Physical Exam  Constitutional: She is oriented to person, place, and time. She appears well-developed and well-nourished.  HENT:  Head: Normocephalic.  Large swollen tonsils, thick white exudate  Eyes: EOM are normal. Pupils are equal, round, and reactive to light.  Neck: Normal range of motion.  Cardiovascular: Normal rate.   Pulmonary/Chest: Effort normal.  Abdominal: Soft. She exhibits no distension.  Musculoskeletal: Normal range of motion.  Neurological: She is alert and oriented to person, place, and time.  Psychiatric: She has a normal mood and affect.  Nursing note and vitals reviewed.   ED Course  Procedures (including critical care time) Labs Review Labs Reviewed  CBC WITH DIFFERENTIAL - Abnormal; Notable for the following:    RBC 3.81 (*)    Hemoglobin 9.5 (*)    HCT 30.6 (*)    MCH 24.9 (*)    RDW 17.0 (*)    All other components within normal limits  BASIC METABOLIC PANEL - Abnormal; Notable for the following:    Glucose, Bld 101 (*)    All other components within normal limits  MONONUCLEOSIS SCREEN    Imaging Review No results found.   EKG Interpretation None  MDM   Final diagnoses:  Tonsillitis        Fransico Meadow, PA-C 07/22/14 [place], MD 07/24/14 [phone]

## 2014-07-22 NOTE — ED Notes (Signed)
Sore throat, feet swelling and dizziness for 2 weeks. She was treated for pharyngitis 2 weeks ago.

## 2014-07-22 NOTE — Discharge Instructions (Signed)

## 2014-09-05 ENCOUNTER — Emergency Department (HOSPITAL_BASED_OUTPATIENT_CLINIC_OR_DEPARTMENT_OTHER)
Admission: EM | Admit: 2014-09-05 | Discharge: 2014-09-05 | Disposition: A | Discharge status: Home / Self Care | Payer: Self-pay | Attending: Emergency Medicine | Admitting: Emergency Medicine

## 2014-09-05 ENCOUNTER — Encounter (HOSPITAL_BASED_OUTPATIENT_CLINIC_OR_DEPARTMENT_OTHER): Payer: Self-pay

## 2014-09-05 DIAGNOSIS — Z791 Long term (current) use of non-steroidal anti-inflammatories (NSAID): Secondary | ICD-10-CM | POA: Insufficient documentation

## 2014-09-05 DIAGNOSIS — E669 Obesity, unspecified: Secondary | ICD-10-CM | POA: Insufficient documentation

## 2014-09-05 DIAGNOSIS — Z7952 Long term (current) use of systemic steroids: Secondary | ICD-10-CM | POA: Insufficient documentation

## 2014-09-05 DIAGNOSIS — Z79899 Other long term (current) drug therapy: Secondary | ICD-10-CM | POA: Insufficient documentation

## 2014-09-05 DIAGNOSIS — J029 Acute pharyngitis, unspecified: Secondary | ICD-10-CM | POA: Insufficient documentation

## 2014-09-05 DIAGNOSIS — Z8719 Personal history of other diseases of the digestive system: Secondary | ICD-10-CM | POA: Insufficient documentation

## 2014-09-05 DIAGNOSIS — Z8679 Personal history of other diseases of the circulatory system: Secondary | ICD-10-CM | POA: Insufficient documentation

## 2014-09-05 DIAGNOSIS — Z8742 Personal history of other diseases of the female genital tract: Secondary | ICD-10-CM | POA: Insufficient documentation

## 2014-09-05 HISTORY — DX: Acute tonsillitis, unspecified: J03.90

## 2014-09-05 LAB — RAPID STREP SCREEN (MED CTR MEBANE ONLY): Streptococcus, Group A Screen (Direct): NEGATIVE

## 2014-09-05 MED ORDER — PENICILLIN G BENZATHINE 1200000 UNIT/2ML IM SUSP
1.2000 10*6.[IU] | Freq: Once | INTRAMUSCULAR | Status: AC
  Administered 2014-09-05: 1.2 10*6.[IU] via INTRAMUSCULAR
  Filled 2014-09-05: qty 2

## 2014-09-05 MED ORDER — IBUPROFEN 100 MG/5ML PO SUSP: 600.0000 mg | Freq: Three times a day (TID) | ORAL | Status: DC | PRN

## 2014-09-05 MED ORDER — DEXAMETHASONE SODIUM PHOSPHATE 10 MG/ML IJ SOLN
10.0000 mg | Freq: Once | INTRAMUSCULAR | Status: AC
  Administered 2014-09-05: 10 mg via INTRAMUSCULAR
  Filled 2014-09-05: qty 1

## 2014-09-05 NOTE — ED Notes (Signed)
Cold symptoms and sore throat and pain with swallowing that started yesterday.

## 2014-09-05 NOTE — ED Provider Notes (Signed)
CSN: [id number]     Arrival date & time 09/05/14  0906 History   First MD Initiated Contact with Patient 09/05/14 1008     Chief Complaint  Patient presents with  . Sore Throat     (Consider location/radiation/quality/duration/timing/severity/associated sxs/prior Treatment) HPI The patient reports she had some mild cold symptoms and quickly developed a sore throat. She reports last night her throat felt very painful to swallow and swollen. She has not had any difficulty breathing. She has not had any difficulty swallowing secretions or liquids. The patient ports this morning she looked in her throat and saw all the white spots on it that she had at the end of November when she got treated for tonsillitis. She reports at that visit she was given antibiotics and the symptoms resolved. She reports she has had a fever at home to 102. No nausea or vomiting. Minimal associated cough. No difficulty breathing. Past Medical History  Diagnosis Date  . Gastric ulcer   . Migraines   . Fibroid   . Tonsillitis    History reviewed. No pertinent past surgical history. Family History  Problem Relation Age of Onset  . Hypertension Mother   . Stroke Maternal Grandmother   . Stroke Maternal Grandfather    History  Substance Use Topics  . Smoking status: Never Smoker   . Smokeless tobacco: Never Used  . Alcohol Use: Yes     Comment: occassionally   OB History    Gravida Para Term Preterm AB TAB SAB Ectopic Multiple Living   0 0 0 0 0 0 0 0 0 0      Review of Systems 10 Systems reviewed and are negative for acute change except as noted in the HPI.    Allergies  Review of patient's allergies indicates no known allergies.  Home Medications   Prior to Admission medications   Medication Sig Start Date End Date Taking? Authorizing Provider  dextromethorphan-guaiFENesin (MUCINEX DM) 30-600 MG per 12 hr tablet Take 1 tablet by mouth 2 (two) times daily.   Yes Historical Provider, MD  ibuprofen  (CHILD IBUPROFEN) 100 MG/5ML suspension Take 30 mLs (600 mg total) by mouth every 8 (eight) hours as needed. 09/05/14   [name], MD  naproxen (NAPROSYN) 500 MG tablet Take 1 tablet (500 mg total) by mouth 2 (two) times daily. 02/10/14   Hope Bunnie Pion, NP  predniSONE (DELTASONE) 10 MG tablet Take 2 tablets (20 mg total) by mouth 2 (two) times daily. 06/27/14   Veryl Speak, MD  ranitidine (ZANTAC) 150 MG tablet Take 1 tablet (150 mg total) by mouth 2 (two) times daily. 06/27/11 06/26/12  Threasa Beards, MD   BP 136/63 mmHg  Pulse 104  Temp(Src) 98.3 F (36.8 C) (Oral)  Resp 18  Ht 5\' 7"  (1.702 m)  Wt 247 lb 3.2 oz (112.129 kg)  BMI 38.71 kg/m2  SpO2 100%  LMP 08/05/2014 Physical Exam  Constitutional: She is oriented to person, place, and time.  The patient is obese. She is alert and nontoxic. She is sitting at the edge of the stretcher with no respiratory distress.  HENT:  Head: Normocephalic and atraumatic.  Nose: Nose normal.  Mouth/Throat: Oropharyngeal exudate present.  Eyes: EOM are normal. Pupils are equal, round, and reactive to light.  Neck: Neck supple.  Cardiovascular: Normal rate, regular rhythm, normal heart sounds and intact distal pulses.   Pulmonary/Chest: Effort normal and breath sounds normal.  Musculoskeletal: Normal range of motion.  Lymphadenopathy:  She has cervical adenopathy (the patient has small cervical lymphadenopathy symmetric and tender. No meningismus or trismus.).  Neurological: She is alert and oriented to person, place, and time. She has normal strength. No cranial nerve deficit. Coordination normal. GCS eye subscore is 4. GCS verbal subscore is 5. GCS motor subscore is 6.  Skin: Skin is warm, dry and intact.  Psychiatric: She has a normal mood and affect.        ED Course  Procedures (including critical care time) Labs Review Labs Reviewed  RAPID STREP SCREEN  CULTURE, GROUP A STREP    Imaging Review No results found.   EKG  Interpretation None      MDM   Final diagnoses:  Exudative pharyngitis   The patient resents with exudative pharyngitis and fever at home. At this time rapid strep is negative. The patient was treated with antibiotics approximately a month and a half ago for reportedly similar exudative pharyngitis. At this point I did feel that antibiotics are indicated based on her history and physical examination. The patient is nontoxic, she does not have meningismus nor trismus. She will be treated with Bicillin L-A and Decadron. She is counseled to contact ENT for follow-up given recurrence of large exudative pharyngitis within under 2 months.    [name], MD 09/05/14 1030

## 2014-09-05 NOTE — Discharge Instructions (Signed)
Tonsillitis Tonsillitis is an infection of the throat that causes the tonsils to become red, tender, and swollen. Tonsils are collections of lymphoid tissue at the back of the throat. Each tonsil has crevices (crypts). Tonsils help fight nose and throat infections and keep infection from spreading to other parts of the body for the first 18 months of life.  CAUSES Sudden (acute) tonsillitis is usually caused by infection with streptococcal bacteria. Long-lasting (chronic) tonsillitis occurs when the crypts of the tonsils become filled with pieces of food and bacteria, which makes it easy for the tonsils to become repeatedly infected. SYMPTOMS  Symptoms of tonsillitis include:  A sore throat, with possible difficulty swallowing.  White patches on the tonsils.  Fever.  Tiredness.  New episodes of snoring during sleep, when you did not snore before.  Small, foul-smelling, yellowish-white pieces of material (tonsilloliths) that you occasionally cough up or spit out. The tonsilloliths can also cause you to have bad breath. DIAGNOSIS Tonsillitis can be diagnosed through a physical exam. Diagnosis can be confirmed with the results of lab tests, including a throat culture. TREATMENT  The goals of tonsillitis treatment include the reduction of the severity and duration of symptoms and prevention of associated conditions. Symptoms of tonsillitis can be improved with the use of steroids to reduce the swelling. Tonsillitis caused by bacteria can be treated with antibiotic medicines. Usually, treatment with antibiotic medicines is started before the cause of the tonsillitis is known. However, if it is determined that the cause is not bacterial, antibiotic medicines will not treat the tonsillitis. If attacks of tonsillitis are severe and frequent, your health care provider may recommend surgery to remove the tonsils (tonsillectomy). HOME CARE INSTRUCTIONS   Rest as much as possible and get plenty of  sleep.  Drink plenty of fluids. While the throat is very sore, eat soft foods or liquids, such as sherbet, soups, or instant breakfast drinks.  Eat frozen ice pops.  Gargle with a warm or cold liquid to help soothe the throat. Mix 1/4 teaspoon of salt and 1/4 teaspoon of baking soda in 8 oz of water. SEEK MEDICAL CARE IF:   Large, tender lumps develop in your neck.  A rash develops.  A green, yellow-brown, or bloody substance is coughed up.  You are unable to swallow liquids or food for 24 hours.  You notice that only one of the tonsils is swollen. SEEK IMMEDIATE MEDICAL CARE IF:   You develop any new symptoms such as vomiting, severe headache, stiff neck, chest pain, or trouble breathing or swallowing.  You have severe throat pain along with drooling or voice changes.  You have severe pain, unrelieved with recommended medications.  You are unable to fully open the mouth.  You develop redness, swelling, or severe pain anywhere in the neck.  You have a fever. MAKE SURE YOU:   Understand these instructions.  Will watch your condition.  Will get help right away if you are not doing well or get worse. Document Released: 05/26/2005 Document Revised: 12/31/2013 Document Reviewed: 02/02/2013 [employer] Patient Information 2015 [place], [place]. This information is not intended to replace advice given to you by your health care provider. Make sure you discuss any questions you have with your health care provider.  Emergency Department Resource Guide 1) Find a Doctor and Pay Out of Pocket Although you won't have to find out who is covered by your insurance plan, it is a good idea to ask around and get recommendations. You will then need  to call the office and see if the doctor you have chosen will accept you as a new patient and what types of options they offer for patients who are self-pay. Some doctors offer discounts or will set up payment plans for their patients who do not have  insurance, but you will need to ask so you aren't surprised when you get to your appointment.  2) Contact Your Local Health Department Not all health departments have doctors that can see patients for sick visits, but many do, so it is worth a call to see if yours does. If you don't know where your local health department is, you can check in your phone book. The CDC also has a tool to help you locate your state's health department, and many state websites also have listings of all of their local health departments.  3) Find a [facility] If your illness is not likely to be very severe or complicated, you may want to try a walk in clinic. These are popping up all over the country in pharmacies, drugstores, and shopping centers. They're usually staffed by nurse practitioners or physician assistants that have been trained to treat common illnesses and complaints. They're usually fairly quick and inexpensive. However, if you have serious medical issues or chronic medical problems, these are probably not your best option.  No Primary Care Doctor: - Call Health Connect at  [phone] - they can help you locate a primary care doctor that  accepts your insurance, provides certain services, etc. - Physician Referral Service- [phone]  Chronic Pain Problems: Organization         Address  Phone   Notes  [facility]  [phone] Patients need to be referred by their primary care doctor.   Medication Assistance: Organization         Address  Phone   Notes  [facility] [address] [place], [place] [address] [phone] --Must be a resident of [facility] -- Must have NO insurance coverage whatsoever (no Medicaid/ Medicare, etc.) -- The pt. MUST have a primary care doctor that directs their care regularly and follows them in the community   [facility]ist  [phone]   [employer]  [phone]    Agencies that provide  inexpensive medical care: Organization         Address  Phone   Notes  [place]  [phone]   [facility]    [phone]   [facility] [place], [place] [address] [phone]   [place] [address], [place] [phone]   Planned Parenthood    [phone]   [facility]    [phone]   [place] and [place] [address], [place] Phone:  [phone], Fax:  [phone] Hours of Operation:  9 am - 6 pm, M-F.  Also accepts Medicaid/Medicare and self-pay.  [facility] for [place], [address], [place] Phone: [phone], Fax: [phone]. Hours of Operation:  8:30 am - 5:30 pm, M-F.  Also accepts Medicaid and self-pay.  [facility] [address] [place] Phone: [phone]   [place], [place], [place] [phone], Ext. [phone] Mondays & Thursdays: 7-9 AM.  First 15 patients are seen on a first come, first serve basis.  [place] Providers:  Organization         Address  Phone   Notes  [facility] [address], [place] [phone] Also accepts self-pay patients.  [facility] [address], [place]  [phone]   [place], [address], [place] [phone]   [facility] [address], [place] [phone]   Lucianne Lei [address], [place]   [phone] Only accepts [place] Access [place] patients after they have their name applied to their card.   Self-Pay (no insurance) in [facility]:  Organization         Address  Phone   Notes  Sickle Cell Patients, [facility] [place] [phone]   [facility] [place] [phone]   [facility] [place]  [place], [place], [place] [phone]   [facility]/Dr. Osei-Bonsu  [address] [place] or [address], [place] [phone] Phone number for both [place] and [place] locations is the same.  [facility] [address], [place] [phone]   [facility] [address] [place] or [address] [phone]   [facility] [address] [place] [phone], phone; [phone], fax Sees patients 1st and 3rd Saturday of every month.  Must not qualify for public or private insurance (i.e. Medicaid, Medicare, [place] Health Choice, Veterans' Benefits)  Household income should be no more than 200% of the poverty level The clinic cannot treat you if you are pregnant or think you are pregnant  Sexually transmitted diseases are not treated at the clinic.    Dental Care: Organization         Address  Phone  Notes  [facility] of [facility] [phone] Accepts children up to age 37 who are enrolled in [place] or [place]; pregnant women with a Medicaid card; and children who have applied for Medicaid or [place] Health Choice, but were declined, whose parents can pay a reduced fee at time of service.  [facility]  [address], [place] [phone] Accepts children up to age 44 who are enrolled in [place] or [place]; pregnant women with a Medicaid card; and children who have applied for Medicaid or [place] Health Choice, but were declined, whose parents can pay a reduced fee at time of service.  [place] Adult Dental Access PROGRAM  [place] [phone] Patients are seen by appointment only. Walk-ins are  not accepted. [place] will see patients 61 years of age and older. Monday - Tuesday (8am-5pm) Most Wednesdays (8:30-5pm) $30 per visit, cash only  [facility] Adult Dental Access PROGRAM  [address], [facility] [phone] Patients are seen by appointment only. Walk-ins are not accepted. [place] will see patients 60 years of age and older. One Wednesday Evening (Monthly: Volunteer Based).  $30 per visit, cash only  [place]  [phone] for adults; Children under age 54, call Graduate Pediatric Dentistry at [phone]. Children aged 35-14, please call [phone] to request a  pediatric application.  Dental services are provided in all areas of dental care including fillings, crowns and bridges, complete and partial dentures, implants, gum treatment, root canals, and extractions. Preventive care is also provided. Treatment is provided to both adults and children. Patients are selected via a lottery and there is often a waiting list.   [facility] [address], [place]  [phone] [email or web address]   [facility] [address] [place], [place] [phone], Ext. [phone] Second and Fourth Thursday of each month, opens at 6:30 AM; Clinic ends at 9 AM.  Patients are seen on a first-come first-served basis, and a limited number are seen during each clinic.   [facility]  [address] Hillard Danker [place], [place] [phone]   Eligibility Requirements You must have lived in [place], [place], or [place] counties for at least the last three months.   You cannot be eligible for state or federal sponsored [employer], including [employer], [place], or [employer].   You generally cannot be eligible for healthcare insurance through your employer.    How to apply: Eligibility screenings are held every Tuesday and Wednesday afternoon from 1:00 pm until 4:00 pm. You do not need an appointment for  the interview!  [facility] [address], [place], [place]   [place]  [place] Department  [place]  [phone]    Behavioral Health Resources in the Community: Intensive Outpatient Programs Organization         Address  Phone  Notes  [place]. [address], [place], [place] [phone]   [facility] Outpatient [address], [place], [place]   ADS: Alcohol & Drug Svcs [address] [place], [place]   [place] [address],  [place], [place] or [phone]   Substance Abuse Resources Organization         Address  Phone  Notes  Alcohol and Drug Services  [phone]   [place]  [phone]   [facility]   Chinita Pester  [phone]   Residential & Outpatient Substance Abuse Program  [phone]   Psychological Services Organization         Address  Phone  Notes  [facility]  [place]  [phone]   [place] [address] [place] or [phone]    Mobile Crisis Teams Organization         Address  Phone  Notes  [facility], Mobile Crisis Care Unit  [phone]   [facility]  [address] [place], [place]   [place] [address] [place] [phone]    Self-Help/Support Groups Organization         Address  Phone             Notes  [place]. of [place] - variety of support groups  [place] Call for more information  Narcotics Anonymous (NA), [facility] [address], [employer] [place]  2 meetings at this location   Brewing technologist  Notes  ASAP Residential Treatment  [place],    [place]  [phone]   [place]  [place], [place]  [id number], [place], [place]   [place], [place] [phone] Admissions: 8am-3pm M-F  Incentives Substance [place] [address],    [place], [place] [phone]   [facility] [address] [place], [place], [place]   [facility] [address],  [place], [place]   [facility] - Intensive Outpatient [address], [place], [place]   [facility] [address].,  [place], [place] [phone] or [phone]   [facility]idential Treatment Services (RTS) [address] [place], [place] Accepts Medicaid  [facility] [place] [address]  [place] [phone] Substance Abuse/Addiction Treatment   [facility] Organization         Address  Phone  Notes  [facility]  [phone]   Domenic Schwab, PhD [address] Arlis Porta [place], [place]   [phone] or [phone]   [place], [place] [phone]   [facility] [address] [place], [place] [phone] Insurance/Medicaid/sponsorship through [facility] and Families [address], [address] [place]                                    [place], [place] [phone] [place] [address], [place] [phone]    Dr. Adele Schilder  [phone]   [facility] Dept. 1) [address],  2) [place] 3)  [place] [address], [place] [phone]  [phone]   [place] [phone] or [phone] (After Hours)

## 2014-09-08 LAB — CULTURE, GROUP A STREP

## 2014-09-09 ENCOUNTER — Telehealth (HOSPITAL_COMMUNITY): Payer: Self-pay

## 2014-09-09 NOTE — ED Notes (Signed)
Post ED Visit - Positive Culture Follow-up  Culture report reviewed by antimicrobial stewardship pharmacist: []  Wes [place], Pharm.D., BCPS []  Heide Guile, Pharm.D., BCPS [x]  Alycia Rossetti, Pharm.D., BCPS []  [place], Pharm.D., BCPS, AAHIVP []  Legrand [place]o, Pharm.D., BCPS, AAHIVP []  Isac Sarna, Pharm.D., BCPS  Positive strep culture Treated with bicillin LA, organism sensitive to the same and no further patient follow-up is required at this time.  Ileene Musa 09/09/2014, 11:22 AM

## 2014-09-12 ENCOUNTER — Encounter (HOSPITAL_BASED_OUTPATIENT_CLINIC_OR_DEPARTMENT_OTHER): Payer: Self-pay | Admitting: *Deleted

## 2014-09-12 ENCOUNTER — Emergency Department (HOSPITAL_BASED_OUTPATIENT_CLINIC_OR_DEPARTMENT_OTHER)
Admission: EM | Admit: 2014-09-12 | Discharge: 2014-09-13 | Disposition: A | Discharge status: Home / Self Care | Payer: Self-pay | Attending: Emergency Medicine | Admitting: Emergency Medicine

## 2014-09-12 DIAGNOSIS — Z79899 Other long term (current) drug therapy: Secondary | ICD-10-CM | POA: Insufficient documentation

## 2014-09-12 DIAGNOSIS — M545 Low back pain, unspecified: Secondary | ICD-10-CM

## 2014-09-12 DIAGNOSIS — Z791 Long term (current) use of non-steroidal anti-inflammatories (NSAID): Secondary | ICD-10-CM | POA: Insufficient documentation

## 2014-09-12 DIAGNOSIS — Z8679 Personal history of other diseases of the circulatory system: Secondary | ICD-10-CM | POA: Insufficient documentation

## 2014-09-12 DIAGNOSIS — Z862 Personal history of diseases of the blood and blood-forming organs and certain disorders involving the immune mechanism: Secondary | ICD-10-CM | POA: Insufficient documentation

## 2014-09-12 DIAGNOSIS — K259 Gastric ulcer, unspecified as acute or chronic, without hemorrhage or perforation: Secondary | ICD-10-CM | POA: Insufficient documentation

## 2014-09-12 DIAGNOSIS — Z7952 Long term (current) use of systemic steroids: Secondary | ICD-10-CM | POA: Insufficient documentation

## 2014-09-12 DIAGNOSIS — Z8719 Personal history of other diseases of the digestive system: Secondary | ICD-10-CM | POA: Insufficient documentation

## 2014-09-12 DIAGNOSIS — Z3202 Encounter for pregnancy test, result negative: Secondary | ICD-10-CM | POA: Insufficient documentation

## 2014-09-12 LAB — URINALYSIS, ROUTINE W REFLEX MICROSCOPIC
Bilirubin Urine: NEGATIVE
Glucose, UA: NEGATIVE mg/dL
Hgb urine dipstick: NEGATIVE
Ketones, ur: NEGATIVE mg/dL
Leukocytes, UA: NEGATIVE
Nitrite: NEGATIVE
Protein, ur: NEGATIVE mg/dL
Specific Gravity, Urine: 1.005 (ref 1.005–1.030)
Urobilinogen, UA: 1 mg/dL (ref 0.0–1.0)
pH: 6.5 (ref 5.0–8.0)

## 2014-09-12 LAB — PREGNANCY, URINE: Preg Test, Ur: NEGATIVE

## 2014-09-12 MED ORDER — CYCLOBENZAPRINE HCL 10 MG PO TABS: 10.0000 mg | ORAL_TABLET | Freq: Two times a day (BID) | ORAL | Status: DC | PRN

## 2014-09-12 MED ORDER — IBUPROFEN 800 MG PO TABS: 800.0000 mg | ORAL_TABLET | Freq: Three times a day (TID) | ORAL | Status: DC

## 2014-09-12 MED ORDER — IBUPROFEN 800 MG PO TABS
800.0000 mg | ORAL_TABLET | Freq: Once | ORAL | Status: AC
  Administered 2014-09-12: 800 mg via ORAL
  Filled 2014-09-12: qty 1

## 2014-09-12 MED ORDER — CYCLOBENZAPRINE HCL 10 MG PO TABS
10.0000 mg | ORAL_TABLET | Freq: Once | ORAL | Status: AC
  Administered 2014-09-12: 10 mg via ORAL
  Filled 2014-09-12: qty 1

## 2014-09-12 NOTE — ED Provider Notes (Signed)
CSN: [id number]     Arrival date & time 09/12/14  2213 History  This chart was scribed for Abigail Shanks, MD by Delphia Grates, ED Scribe. This patient was seen in room MH04/MH04 and the patient's care was started at 11:43 PM.    Chief Complaint  Patient presents with  . Back Pain   The history is provided by the patient. No language interpreter was used.     HPI Comments: Abigail Jacobson is a 39 y.o. female who presents to the Emergency Department complaining of sharp, intermittent, nonradiating, lower back pain for the past 2 days. She denies any injury, falls, or trauma. She notes that she can feel an intermittent spasms in the right lower back when ambulating. Patient also notes "some" coughing, and was seen here 1 week ago for a sore throat, sypmtoms significantly improved but still notes some pain with swallowing. She denies bowel/bladder incontinence, urinary symptoms, or abdominal pain, CP or SOB.   Past Medical History  Diagnosis Date  . Gastric ulcer   . Migraines   . Fibroid   . Tonsillitis    History reviewed. No pertinent past surgical history. Family History  Problem Relation Age of Onset  . Hypertension Mother   . Stroke Maternal Grandmother   . Stroke Maternal Grandfather    History  Substance Use Topics  . Smoking status: Never Smoker   . Smokeless tobacco: Never Used  . Alcohol Use: Yes     Comment: occassionally   OB History    Gravida Para Term Preterm AB TAB SAB Ectopic Multiple Living   0 0 0 0 0 0 0 0 0 0      Review of Systems  Constitutional: Negative for fever and chills.  Respiratory: Positive for cough. Negative for shortness of breath.   Cardiovascular: Negative for chest pain.  Gastrointestinal: Negative for abdominal pain.  Genitourinary: Negative for dysuria and frequency.  Musculoskeletal: Positive for back pain. Negative for myalgias and arthralgias.  Skin: Negative for color change and rash.  All other systems reviewed and are  negative.     Allergies  Review of patient's allergies indicates no known allergies.  Home Medications   Prior to Admission medications   Medication Sig Start Date End Date Taking? Authorizing Provider  cyclobenzaprine (FLEXERIL) 10 MG tablet Take 1 tablet (10 mg total) by mouth 2 (two) times daily as needed for muscle spasms. 09/12/14   Abigail Shanks, MD  dextromethorphan-guaiFENesin [facility] DM) 30-600 MG per 12 hr tablet Take 1 tablet by mouth 2 (two) times daily.    Historical Provider, MD  ibuprofen (ADVIL,MOTRIN) 800 MG tablet Take 1 tablet (800 mg total) by mouth 3 (three) times daily. 09/12/14   Abigail Shanks, MD  ibuprofen (CHILD IBUPROFEN) 100 MG/5ML suspension Take 30 mLs (600 mg total) by mouth every 8 (eight) hours as needed. 09/05/14   Abigail Shanks, MD  naproxen (NAPROSYN) 500 MG tablet Take 1 tablet (500 mg total) by mouth 2 (two) times daily. 02/10/14   Hope Bunnie Pion, NP  predniSONE (DELTASONE) 10 MG tablet Take 2 tablets (20 mg total) by mouth 2 (two) times daily. 06/27/14   Veryl Speak, MD  ranitidine (ZANTAC) 150 MG tablet Take 1 tablet (150 mg total) by mouth 2 (two) times daily. 06/27/11 06/26/12  Threasa Beards, MD   Triage Vitals: BP 123/85 mmHg  Pulse 98  Temp(Src) 98.8 F (37.1 C) (Oral)  Resp 18  Ht 5\' 7"  (1.702 m)  Wt 242 lb (109.77 kg)  BMI 37.89 kg/m2  SpO2 96%  LMP 08/05/2014  Physical Exam  Constitutional: She is oriented to person, place, and time. She appears well-developed and well-nourished. No distress.  HENT:  Head: Normocephalic and atraumatic.  Tonsils are prominent but no longer have erythema or exudate present. Posterior pharynx is widely patent. Neck is supple without lymphadenopathy or meningismus.  Eyes: Conjunctivae and EOM are normal.  Neck: Neck supple. No tracheal deviation present.  Cardiovascular: Normal rate, regular rhythm and normal heart sounds.   Pulmonary/Chest: Effort normal and breath sounds normal. No respiratory  distress.  Musculoskeletal: Normal range of motion. She exhibits tenderness.  Tenderness at the top of the right SI joint. Range of motion is normal. Patient is able to sit stand and ambulate. And perform straight leg raise.  Neurological: She is alert and oriented to person, place, and time.  5 out of 5 lower extremity strength bilaterally. Sensation intact to light touch.  Skin: Skin is warm and dry.  Psychiatric: She has a normal mood and affect. Her behavior is normal.  Nursing note and vitals reviewed.   ED Course  Procedures (including critical care time)  DIAGNOSTIC STUDIES: Oxygen Saturation is 96% on room air, adequate by my interpretation.    COORDINATION OF CARE: At 2349 Discussed treatment plan with patient which includes referral to ENT and pain medication. Patient agrees.   Labs Review Labs Reviewed  PREGNANCY, URINE  URINALYSIS, ROUTINE W REFLEX MICROSCOPIC    Imaging Review No results found.   EKG Interpretation None      MDM   Final diagnoses:  Right-sided low back pain without sciatica   Patient has no associated neurologic symptoms or dysfunction. No associated GI or GU symptoms. Pain localizes to the SI joint on the right. At this point symptoms most consistent with musculoskeletal etiology. Patient be treated conservatively with anti-inflammatory muscle relaxer. I had seen this patient incidentally for exudative pharyngitis several weeks ago. At this point her oral exam is much improved. She no longer has fever or other significant ENT findings. The patient is counseled however to follow-up with ENT as she has had 2 episodes of recurrent exudative pharyngitis in a relatively short period of time.   Abigail Shanks, MD 09/12/14 [phone]

## 2014-09-12 NOTE — ED Notes (Signed)
Lower back pain x 2 days. No known injury. 

## 2014-09-12 NOTE — Discharge Instructions (Signed)
Back Pain, Adult °Low back pain is very common. About 1 in 5 people have back pain. The cause of low back pain is rarely dangerous. The pain often gets better over time. About half of people with a sudden onset of back pain feel better in just 2 weeks. About 8 in 10 people feel better by 6 weeks.  °CAUSES °Some common causes of back pain include: °· Strain of the muscles or ligaments supporting the spine. °· Wear and tear (degeneration) of the spinal discs. °· Arthritis. °· Direct injury to the back. °DIAGNOSIS °Most of the time, the direct cause of low back pain is not known. However, back pain can be treated effectively even when the exact cause of the pain is unknown. Answering your caregiver's questions about your overall health and symptoms is one of the most accurate ways to make sure the cause of your pain is not dangerous. If your caregiver needs more information, he or she may order lab work or imaging tests (X-rays or MRIs). However, even if imaging tests show changes in your back, this usually does not require surgery. °HOME CARE INSTRUCTIONS °For many people, back pain returns. Since low back pain is rarely dangerous, it is often a condition that people can learn to manage on their own.  °· Remain active. It is stressful on the back to sit or stand in one place. Do not sit, drive, or stand in one place for more than 30 minutes at a time. Take short walks on level surfaces as soon as pain allows. Try to increase the length of time you walk each day. °· Do not stay in bed. Resting more than 1 or 2 days can delay your recovery. °· Do not avoid exercise or work. Your body is made to move. It is not dangerous to be active, even though your back may hurt. Your back will likely heal faster if you return to being active before your pain is gone. °· Pay attention to your body when you  bend and lift. Many people have less discomfort when lifting if they bend their knees, keep the load close to their bodies, and  avoid twisting. Often, the most comfortable positions are those that put less stress on your recovering back. °· Find a comfortable position to sleep. Use a firm mattress and lie on your side with your knees slightly bent. If you lie on your back, put a pillow under your knees. °· Only take over-the-counter or prescription medicines as directed by your caregiver. Over-the-counter medicines to reduce pain and inflammation are often the most helpful. Your caregiver may prescribe muscle relaxant drugs. These medicines help dull your pain so you can more quickly return to your normal activities and healthy exercise. °· Put ice on the injured area. °¨ Put ice in a plastic bag. °¨ Place a towel between your skin and the bag. °¨ Leave the ice on for 15-20 minutes, 03-04 times a day for the first 2 to 3 days. After that, ice and heat may be alternated to reduce pain and spasms. °· Ask your caregiver about trying back exercises and gentle massage. This may be of some benefit. °· Avoid feeling anxious or stressed. Stress increases muscle tension and can worsen back pain. It is important to recognize when you are anxious or stressed and learn ways to manage it. Exercise is a great option. °SEEK MEDICAL CARE IF: °· You have pain that is not relieved with rest or medicine. °· You have pain that does not improve in 1 week. °· You have new symptoms. °· You are generally not feeling well. °SEEK   IMMEDIATE MEDICAL CARE IF:  °· You have pain that radiates from your back into your legs. °· You develop new bowel or bladder control problems. °· You have unusual weakness or numbness in your arms or legs. °· You develop nausea or vomiting. °· You develop abdominal pain. °· You feel faint. °Document Released: 08/16/2005 Document Revised: 02/15/2012 Document Reviewed: 12/18/2013 °ExitCare® Patient Information ©2015 [employer]. This information is not intended to replace advice given to you by your health care provider. Make sure you  discuss any questions you have with your health care provider. ° ° ° ° °Emergency Department Resource Guide °1) Find a Doctor and Pay Out of Pocket °Although you won't have to find out who is covered by your insurance plan, it is a good idea to ask around and get recommendations. You will then need to call the office and see if the doctor you have chosen will accept you as a new patient and what types of options they offer for patients who are self-pay. Some doctors offer discounts or will set up payment plans for their patients who do not have insurance, but you will need to ask so you aren't surprised when you get to your appointment. ° °2) Contact Your Local Health Department °Not all health departments have doctors that can see patients for sick visits, but many do, so it is worth a call to see if yours does. If you don't know where your local health department is, you can check in your phone book. The CDC also has a tool to help you locate your state's health department, and many state websites also have listings of all of their local health departments. ° °3) Find a Walk-in Clinic °If your illness is not likely to be very severe or complicated, you may want to try a walk in clinic. These are popping up all over the country in pharmacies, drugstores, and shopping centers. They're usually staffed by nurse practitioners or physician assistants that have been trained to treat common illnesses and complaints. They're usually fairly quick and inexpensive. However, if you have serious medical issues or chronic medical problems, these are probably not your best option. ° °No Primary Care Doctor: °- Call [facility] at  832-8000 - they can help you locate a primary care doctor that  accepts your insurance, provides certain services, etc. °- Physician Referral Service- 1-800-533-3463 ° °Chronic Pain Problems: °Organization         Address  Phone   Notes  °[facility]  (336) 297-2271 Patients need  to be referred by their primary care doctor.  ° °Medication Assistance: °Organization         Address  Phone   Notes  °[place] [facility] [address] °[place], [place] [address] (336) 641-8030 --Must be a resident of [place] °-- Must have NO insurance coverage whatsoever (no Medicaid/ Medicare, etc.) °-- The pt. MUST have a primary care doctor that directs their care regularly and follows them in the community °  °[facility]  (866) 331-1348   °[employer]  (888) 892-1162   ° °Agencies that provide inexpensive medical care: °Organization         Address  Phone   Notes  °[facility]  (336) 832-8035   °[facility]    (336) 832-7272   °[facility] [address] °[place], [place] [address] (336) 832-4777   °[facility] [place] [address], °[place] (336) 271-4999   °[facility]    (336) 373-0678   °[facility]    (336) 272-1050   °[facility] ° [address], [place] Phone:  (336) 832-4444, Fax:  (336) 832-4440 Hours of Operation:  9 am - 6 pm, M-F.  Also accepts Medicaid/Medicare and self-pay.  °[facility] ° [address], [place] Phone: (336) 832-3150, Fax: (336) 832-3151. Hours of Operation:  8:30 am - 5:30 pm, M-F.  Also accepts Medicaid and self-pay.  °[facility] [place] [address], [place] Phone: (336) 878-6027   °[facility] [address], [place], [place] (336)723-1848, Ext. 123 Mondays & Thursdays: 7-9 AM.  First 15 patients are seen on a first come, first serve basis. °  ° °Medicaid-accepting [place] Providers: ° °Organization         Address  Phone   Notes  °[facility] [address], [place] (336) 641-2100 Also accepts self-pay patients.  °[facility] [address], [place] ° (336) 856-9996   °[facility] [address], [place] (336) 288-8857   °[facility] [address] [place] [address], [place] (336) 299-7000   °Veita Bland [address], [place]  ° (336) 373-1557 Only accepts [place] Access Medicaid patients after they have their name applied to their card.  ° °Self-Pay (no insurance) in [place]: ° °Organization         Address  Phone   Notes  °Sickle Cell Patients, [facility] [address], [place] (336) 832-1970   °[facility] [address], [place] (336) 832-4400   °[facility] ° [address], [place] (336) 992-4800   °[facility]/Dr. Osei-Bonsu ° [address] [place] [address], [place] or [address], [place] (336) 841-8500 Phone number for both [place] and [place] locations is the same.  °[facility] [address], [place] (336) 299-0000   °[facility] [address] [place] [address], [place] or [address] (336) 852-7530 °(336) 878-2260   °[facility] [address], [place] (336) 350-1642, phone; (336) 294-5005, fax Sees patients 1st and 3rd Saturday of every month.  Must not qualify for public or private insurance (i.e. Medicaid, Medicare, [place] Health Choice, Veterans' Benefits) • Household income should be no more than 200% of the poverty level •The clinic cannot treat you if you are pregnant or think you are pregnant • Sexually transmitted diseases are not treated at the clinic.  ° ° °Dental Care: °Organization         Address  Phone  Notes  °[facility] [address],  (336) 641-6152 Accepts children up to age 21 who are enrolled in Medicaid or [place] Health Choice; pregnant women with a Medicaid card; and children who have applied for Medicaid or [place] Health Choice, but were declined, whose  parents can pay a reduced fee at time of service.  °[facility] [place]  [address], [place] (336) 641-7733 Accepts children up to age 21 who are enrolled in Medicaid or [place] Health Choice; pregnant women with a Medicaid card; and children who have applied for Medicaid   or [place] Health Choice, but were declined, whose parents can pay a reduced fee at time of service.  °[facility] ° [address], [place] (336) 641-4533 Patients are seen by appointment only. Walk-ins are not accepted. [facility] will see patients 18 years of age and older. °Monday - Tuesday (8am-5pm) °Most Wednesdays (8:30-5pm) °$30 per visit, cash only  °[facility] ° [address], [place] (336) 641-4533 Patients are seen by appointment only. Walk-ins are not accepted. [facility] will see patients 18 years of age and older. °One Wednesday Evening (Monthly: Volunteer Based).  $30 per visit, cash only  °[facility]  (919) 537-3737 for adults; Children under age 4, call Graduate Pediatric Dentistry at (919) [phone]. Children aged 4-14, please call (919) [phone] to request a pediatric application. ° Dental services are provided in all areas of dental care including fillings, crowns and bridges, complete and partial dentures, implants, gum treatment, root canals, and extractions. Preventive care is also provided. Treatment is provided to both adults and children. °Patients are selected via a lottery and there is often a waiting list. °  °[facility] [address], °[place] ° (336) 763-8833 [email or web address] °  °[facility] [address], [place], [place] (336)723-1848, Ext. 123 Second and Fourth Thursday of each month, opens at 6:30 AM; Clinic ends at 9 AM.  Patients are seen on a first-come first-served basis, and a limited number are seen during each clinic.  ° °[facility] °  [address], [place], [place] (336) 723-7904   Eligibility Requirements °You must have lived in [place], [place], or [place] counties for at least the last three months. °  You cannot be eligible for state or federal sponsored healthcare insurance, including Veterans Administration, Medicaid, or Medicare. °  You generally cannot be eligible for healthcare insurance through your employer.  °  How to apply: °Eligibility screenings are held every Tuesday and Wednesday afternoon from 1:00 pm until 4:00 pm. You do not need an appointment for the interview!  °[facility] [address], [place], [place] 336-631-2330   °[facility] [facility]  336-342-8273   °[facility]  336-703-3100   ° [facility]  336-570-6415   ° °Behavioral Health Resources in the Community: °Intensive Outpatient Programs °Organization         Address  Phone  Notes  °[facility] [address], [place], [place] 336-878-6098   °[facility] [address], [place], [place] 336-832-9800   °[facility] [address], [place], [place] ° 336-882-2125   °[facility] [address],  °[place],  1-800-853-5163 or 336-641-4981   °Substance Abuse Resources °Organization         Address  Phone  Notes  °[facility]  336-882-2125   °[facility]  336-784-9470   °[facility]  336-285-9073   °[facility]  336-845-3988   °Residential & Outpatient Substance Abuse Program  1-800-659-3381   °Psychological Services °Organization         Address  Phone  Notes  °[facility]  336- 832-9600   °[facility]  336- 378-7881   °[facility] [address],  1-800-853-5163 or 336-641-4981   ° °Mobile Crisis Teams °Organization         Address  Phone    Notes  °[facility], Mobile Crisis Care Unit  1-877-626-1772     °Assertive °[facility] ° [address] [place], [place] 336-834-9664   °Sharon DeEsch [address] °[place] 336-554-5454   ° °Self-Help/Support Groups °Organization         Address  Phone             Notes  °[facility] - variety of support groups  336- 373-1402 Call for more information  °Narcotics Anonymous (NA), [facility] [address], °[place]  2 meetings at this location  ° °Residential Treatment Programs °Organization         Address  Phone  Notes  °[facility] [address],    °[place]  1-866-801-8205   °[facility] ° [address], [place], [place] 704-293-8524   °[facility] [address], [place] 336-845-3988 Admissions: 8am-3pm M-F  °[facility] [address]    °[place], [place] 336-841-1104   °[facility] [address], [place], [place] 336-379-7146   °[facility] [address]  °[place], [place] 336-285-9073   °[facility] Intensive Outpatient [address], [place], [place] 336-852-3033   °[facility] [address]  °[place], [place] 1-877-615-2722 or 336-784-9470   °[facility] [address] [place], [place] 336-227-7417 Accepts Medicaid  °[facility] [address]  ° [place] 1-800-659-3381 Substance Abuse/Addiction Treatment  ° °[place] Behavioral Health Resources °Organization         Address  Phone  Notes  °[facility]  (888) 581-9988   °Julie Brannon, PhD [address] [place], [place]   (336) 349-5553 or (336) 951-0000   °[facility]   [address] °[place], [place] (336) 349-4454   °[facility] [address], [place], [place] (336) 342-8316 Insurance/Medicaid/sponsorship through [facility]  °[facility] [address]                                     [place], [place] (336) 342-8316 Therapy/tele-psych/case  °[facility] [address]  ° [place], [place] (336) 349-2233    °Dr. Arfeen  (336) 349-4544   °[facility] [place]  [employer] [place] [facility] 1) [address], [place] °2) [address], [place] °3)  [address], [place] (336) 349-3220 °(336) 342-7768 ° °(336) 342-8140   °[place] [facility] (336) 342-1394 or (336) 342-3537 (After Hours)    ° ° ° ° °

## 2014-12-26 ENCOUNTER — Emergency Department (HOSPITAL_BASED_OUTPATIENT_CLINIC_OR_DEPARTMENT_OTHER): Payer: Self-pay

## 2014-12-26 ENCOUNTER — Observation Stay (HOSPITAL_BASED_OUTPATIENT_CLINIC_OR_DEPARTMENT_OTHER)
Admission: EM | Admit: 2014-12-26 | Discharge: 2014-12-27 | Disposition: A | Discharge status: Home / Self Care | Payer: Self-pay | Attending: Cardiovascular Disease | Admitting: Cardiovascular Disease

## 2014-12-26 ENCOUNTER — Encounter (HOSPITAL_BASED_OUTPATIENT_CLINIC_OR_DEPARTMENT_OTHER): Payer: Self-pay | Admitting: *Deleted

## 2014-12-26 DIAGNOSIS — D649 Anemia, unspecified: Secondary | ICD-10-CM

## 2014-12-26 DIAGNOSIS — I208 Other forms of angina pectoris: Secondary | ICD-10-CM | POA: Insufficient documentation

## 2014-12-26 DIAGNOSIS — R9431 Abnormal electrocardiogram [ECG] [EKG]: Secondary | ICD-10-CM | POA: Insufficient documentation

## 2014-12-26 DIAGNOSIS — R079 Chest pain, unspecified: Principal | ICD-10-CM | POA: Insufficient documentation

## 2014-12-26 LAB — CBC WITH DIFFERENTIAL/PLATELET
Basophils Absolute: 0 10*3/uL (ref 0.0–0.1)
Basophils Relative: 0 % (ref 0–1)
Eosinophils Absolute: 0.1 10*3/uL (ref 0.0–0.7)
Eosinophils Relative: 1 % (ref 0–5)
HCT: 30.1 % — ABNORMAL LOW (ref 36.0–46.0)
Hemoglobin: 9.4 g/dL — ABNORMAL LOW (ref 12.0–15.0)
Lymphocytes Relative: 35 % (ref 12–46)
Lymphs Abs: 2.6 10*3/uL (ref 0.7–4.0)
MCH: 24.7 pg — ABNORMAL LOW (ref 26.0–34.0)
MCHC: 31.2 g/dL (ref 30.0–36.0)
MCV: 79 fL (ref 78.0–100.0)
Monocytes Absolute: 0.7 10*3/uL (ref 0.1–1.0)
Monocytes Relative: 9 % (ref 3–12)
Neutro Abs: 4.2 10*3/uL (ref 1.7–7.7)
Neutrophils Relative %: 55 % (ref 43–77)
Platelets: 303 10*3/uL (ref 150–400)
RBC: 3.81 MIL/uL — ABNORMAL LOW (ref 3.87–5.11)
RDW: 15.2 % (ref 11.5–15.5)
WBC: 7.6 10*3/uL (ref 4.0–10.5)

## 2014-12-26 LAB — BASIC METABOLIC PANEL
Anion gap: 8 (ref 5–15)
BUN: 9 mg/dL (ref 6–23)
CO2: 25 mmol/L (ref 19–32)
Calcium: 9 mg/dL (ref 8.4–10.5)
Chloride: 105 mmol/L (ref 96–112)
Creatinine, Ser: 0.86 mg/dL (ref 0.50–1.10)
GFR calc Af Amer: >90 mL/min (ref >90)
GFR calc non Af Amer: 85 mL/min — ABNORMAL LOW (ref >90)
Glucose, Bld: 103 mg/dL — ABNORMAL HIGH (ref 70–99)
Potassium: 4.1 mmol/L (ref 3.5–5.1)
Sodium: 138 mmol/L (ref 135–145)

## 2014-12-26 LAB — TROPONIN I: Troponin I: <0.03 ng/mL (ref <0.031)

## 2014-12-26 NOTE — ED Provider Notes (Signed)
CSN: [id number]     Arrival date & time 12/26/14  2440 History  This chart was scribed for Leonard Schwartz, MD by Irene Pap, ED Scribe. This patient was seen in room MH01/MH01 and patient care was started at 7:50 PM.      Chief Complaint  Patient presents with  . Chest Pain   Patient is a 39 y.o. female presenting with chest pain. The history is provided by the patient. No language interpreter was used.  Chest Pain Associated symptoms: no diaphoresis, no nausea, no shortness of breath and not vomiting     HPI Comments: Abigail Jacobson is a 39 y.o. female who presents to the Emergency Department complaining of chest pain onset two hours ago. She states that she was walking, then started to feel tight chest pain that lasted 30 minutes. She states that she took a Bayer to relief, but also felt associated right arm pain. She states that both of these problems have since relieved. She also states that her feet have been swelling for the past couple of days, right worse than left. She denies nausea, vomiting, or diaphoresis. She denies feeling pain like this before. She denies smoking, cocaine, history of heart problems, diabetes, HTN, or high cholesterol. She reports a history of MI in the family, but is not sure who exactly had these problems.  Past Medical History  Diagnosis Date  . Gastric ulcer   . Migraines   . Fibroid   . Tonsillitis    History reviewed. No pertinent past surgical history. Family History  Problem Relation Age of Onset  . Hypertension Mother   . Stroke Maternal Grandmother   . Stroke Maternal Grandfather    History  Substance Use Topics  . Smoking status: Never Smoker   . Smokeless tobacco: Never Used  . Alcohol Use: Yes     Comment: occassionally   OB History    Gravida Para Term Preterm AB TAB SAB Ectopic Multiple Living   0 0 0 0 0 0 0 0 0 0      Review of Systems  Constitutional: Negative for diaphoresis.  Respiratory: Negative for shortness of breath.    Cardiovascular: Positive for chest pain and leg swelling.  Gastrointestinal: Negative for nausea and vomiting.  Musculoskeletal: Positive for myalgias.  All other systems reviewed and are negative.  Allergies  Food  Home Medications   Prior to Admission medications   Medication Sig Start Date End Date Taking? Authorizing Provider  aspirin EC 81 MG tablet Take 81 mg by mouth daily as needed for mild pain or moderate pain.   Yes Historical Provider, MD  cetirizine (ZYRTEC) 10 MG tablet Take 10 mg by mouth daily.   Yes Historical Provider, MD  Cholecalciferol (VITAMIN D PO) Take 1 tablet by mouth daily.   Yes Historical Provider, MD  [facility] Liver Oil (COD LIVER PO) Take 1 tablet by mouth daily.   Yes Historical Provider, MD  GINKGO BILOBA PO Take 1 tablet by mouth daily.   Yes Historical Provider, MD  montelukast (SINGULAIR) 10 MG tablet Take 10 mg by mouth at bedtime.   Yes Historical Provider, MD   BP 123/76 mmHg  Pulse 96  Temp(Src) 98.1 F (36.7 C)  Resp 16  Ht 5\' 7"  (1.702 m)  Wt 200 lb (90.719 kg)  BMI 31.32 kg/m2  SpO2 98%  LMP 12/19/2014 Physical Exam  Constitutional: She is oriented to person, place, and time. She appears well-developed and well-nourished. No distress.  HENT:  Head:  Normocephalic and atraumatic.  Eyes: Pupils are equal, round, and reactive to light.  Neck: Normal range of motion.  Cardiovascular: Normal rate and intact distal pulses.   Pulmonary/Chest: No respiratory distress.  Abdominal: Normal appearance. She exhibits no distension.  Musculoskeletal: Normal range of motion. She exhibits edema (1+ bilateral both ankles).  Neurological: She is alert and oriented to person, place, and time. No cranial nerve deficit.  Skin: Skin is warm and dry. No rash noted.  Psychiatric: She has a normal mood and affect. Her behavior is normal.  Nursing note and vitals reviewed.   ED Course  Procedures (including critical care time) DIAGNOSTIC STUDIES: Oxygen  Saturation is 98% on room air, normal by my interpretation.    COORDINATION OF CARE: 7:56 PM-Discussed treatment plan which includes EKG, IV, and labs with pt at bedside and pt agreed to plan.   Labs Review Labs Reviewed  BASIC METABOLIC PANEL - Abnormal; Notable for the following:    Glucose, Bld 103 (*)    GFR calc non Af Amer 85 (*)    All other components within normal limits  CBC WITH DIFFERENTIAL/PLATELET - Abnormal; Notable for the following:    RBC 3.81 (*)    Hemoglobin 9.4 (*)    HCT 30.1 (*)    MCH 24.7 (*)    All other components within normal limits  CBC - Abnormal; Notable for the following:    RBC 3.84 (*)    Hemoglobin 9.3 (*)    HCT 30.0 (*)    MCH 24.2 (*)    All other components within normal limits  RETICULOCYTES - Abnormal; Notable for the following:    RBC. 3.84 (*)    All other components within normal limits  HEMOGLOBIN A1C - Abnormal; Notable for the following:    Hgb A1c MFr Bld 6.2 (*)    All other components within normal limits  FERRITIN - Abnormal; Notable for the following:    Ferritin 8 (*)    All other components within normal limits  IRON AND TIBC - Abnormal; Notable for the following:    Iron 30 (*)    Saturation Ratios 7 (*)    All other components within normal limits  TROPONIN I  CREATININE, SERUM  TSH  BRAIN NATRIURETIC PEPTIDE  LIPID PANEL  URINE RAPID DRUG SCREEN (HOSP PERFORMED)  PREGNANCY, URINE  TROPONIN I  HEPATIC FUNCTION PANEL    Imaging Review No results found.  EKG Interpretation   Date/Time:  Thursday December 26 2014 19:34:21 EDT Ventricular Rate:  98 PR Interval:  134 QRS Duration: 98 QT Interval:  352 QTC Calculation: 449 R Axis:   55 Text Interpretation:  Normal sinus rhythm ST \\T \ T wave abnormality,  consider inferior ischemia ST \\T \ T wave abnormality, consider  anterolateral ischemia Abnormal ECG Confirmed by Amine Adelson  MD, Hennesy Sobalvarro  ([id number]) on 12/26/2014 7:50:05 PM      MDM   Final diagnoses:   Chest pain    I personally performed the services described in this documentation, which was scribed in my presence. The recorded information has been reviewed and considered.     Leonard Schwartz, MD 01/13/15 [phone]

## 2014-12-26 NOTE — ED Notes (Signed)
Pt reports bilateral feet swelling x couple days and (R) sided chest tightness x 2 hours PTA.  denies SOB, N/V.

## 2014-12-27 DIAGNOSIS — I208 Other forms of angina pectoris: Secondary | ICD-10-CM | POA: Insufficient documentation

## 2014-12-27 DIAGNOSIS — R9431 Abnormal electrocardiogram [ECG] [EKG]: Secondary | ICD-10-CM

## 2014-12-27 DIAGNOSIS — I2089 Other forms of angina pectoris: Secondary | ICD-10-CM | POA: Insufficient documentation

## 2014-12-27 DIAGNOSIS — D649 Anemia, unspecified: Secondary | ICD-10-CM

## 2014-12-27 DIAGNOSIS — R079 Chest pain, unspecified: Secondary | ICD-10-CM

## 2014-12-27 LAB — IRON AND TIBC
Iron: 30 ug/dL — ABNORMAL LOW (ref 42–145)
Saturation Ratios: 7 % — ABNORMAL LOW (ref 20–55)
TIBC: 423 ug/dL (ref 250–470)
UIBC: 393 ug/dL (ref 125–400)

## 2014-12-27 LAB — LIPID PANEL
Cholesterol: 139 mg/dL (ref 0–200)
HDL: 57 mg/dL (ref >39)
LDL Cholesterol: 71 mg/dL (ref 0–99)
Total CHOL/HDL Ratio: 2.4 RATIO
Triglycerides: 56 mg/dL (ref <150)
VLDL: 11 mg/dL (ref 0–40)

## 2014-12-27 LAB — CBC
HCT: 30 % — ABNORMAL LOW (ref 36.0–46.0)
Hemoglobin: 9.3 g/dL — ABNORMAL LOW (ref 12.0–15.0)
MCH: 24.2 pg — ABNORMAL LOW (ref 26.0–34.0)
MCHC: 31 g/dL (ref 30.0–36.0)
MCV: 78.1 fL (ref 78.0–100.0)
Platelets: 365 10*3/uL (ref 150–400)
RBC: 3.84 MIL/uL — ABNORMAL LOW (ref 3.87–5.11)
RDW: 15.3 % (ref 11.5–15.5)
WBC: 8.2 10*3/uL (ref 4.0–10.5)

## 2014-12-27 LAB — CREATININE, SERUM
Creatinine, Ser: 0.78 mg/dL (ref 0.50–1.10)
GFR calc Af Amer: >90 mL/min (ref >90)
GFR calc non Af Amer: >90 mL/min (ref >90)

## 2014-12-27 LAB — HEPATIC FUNCTION PANEL
ALT: 11 U/L (ref 0–35)
AST: 17 U/L (ref 0–37)
Albumin: 3.6 g/dL (ref 3.5–5.2)
Alkaline Phosphatase: 59 U/L (ref 39–117)
Bilirubin, Direct: <0.1 mg/dL (ref 0.0–0.5)
Total Bilirubin: 0.5 mg/dL (ref 0.3–1.2)
Total Protein: 7 g/dL (ref 6.0–8.3)

## 2014-12-27 LAB — RAPID URINE DRUG SCREEN, HOSP PERFORMED
Amphetamines: NOT DETECTED
Barbiturates: NOT DETECTED
Benzodiazepines: NOT DETECTED
Cocaine: NOT DETECTED
Opiates: NOT DETECTED
Tetrahydrocannabinol: NOT DETECTED

## 2014-12-27 LAB — FERRITIN: Ferritin: 8 ng/mL — ABNORMAL LOW (ref 10–291)

## 2014-12-27 LAB — PREGNANCY, URINE: Preg Test, Ur: NEGATIVE

## 2014-12-27 LAB — RETICULOCYTES
RBC.: 3.84 MIL/uL — ABNORMAL LOW (ref 3.87–5.11)
Retic Count, Absolute: 61.4 10*3/uL (ref 19.0–186.0)
Retic Ct Pct: 1.6 % (ref 0.4–3.1)

## 2014-12-27 LAB — BRAIN NATRIURETIC PEPTIDE: B Natriuretic Peptide: 36.3 pg/mL (ref 0.0–100.0)

## 2014-12-27 LAB — TROPONIN I: Troponin I: <0.03 ng/mL (ref <0.031)

## 2014-12-27 LAB — TSH: TSH: 2.028 u[IU]/mL (ref 0.350–4.500)

## 2014-12-27 MED ORDER — SODIUM CHLORIDE 0.9 % IJ SOLN
3.0000 mL | Freq: Two times a day (BID) | INTRAMUSCULAR | Status: DC
  Administered 2014-12-27 (×2): 3 mL via INTRAVENOUS

## 2014-12-27 MED ORDER — ASPIRIN EC 81 MG PO TBEC
81.0000 mg | DELAYED_RELEASE_TABLET | Freq: Every day | ORAL | Status: DC
  Administered 2014-12-27: 81 mg via ORAL
  Filled 2014-12-27: qty 1

## 2014-12-27 MED ORDER — ENOXAPARIN SODIUM 40 MG/0.4ML ~~LOC~~ SOLN
40.0000 mg | SUBCUTANEOUS | Status: DC
  Administered 2014-12-27: 40 mg via SUBCUTANEOUS
  Filled 2014-12-27: qty 0.4

## 2014-12-27 MED ORDER — MONTELUKAST SODIUM 10 MG PO TABS: 10.0000 mg | ORAL_TABLET | Freq: Every day | ORAL | Status: DC

## 2014-12-27 MED ORDER — FAMOTIDINE 20 MG PO TABS
20.0000 mg | ORAL_TABLET | Freq: Every day | ORAL | Status: DC
  Administered 2014-12-27: 20 mg via ORAL
  Filled 2014-12-27: qty 1

## 2014-12-27 NOTE — Discharge Instructions (Signed)
You are mildly anemic. Your iron levels are low and you would benefit from an iron supplement. A multivitamin with iron or a separate iron supplement would be beneficial.  Follow-up with primary care as soon as possible.

## 2014-12-27 NOTE — Progress Notes (Signed)
Mild heart heart murmur noted on auscultation. Pt denies any chest pain at this time. Will continue to monitor.

## 2014-12-27 NOTE — Discharge Summary (Signed)
CARDIOLOGY DISCHARGE SUMMARY   Patient ID: Abigail Jacobson MRN: [id number] DOB/AGE: Jan 14, 1976 39 y.o.  Admit date: 12/26/2014 Discharge date: 01/01/2015  PCP: No PCP Per Patient Primary Cardiologist: Dr. Burt Knack  Primary Discharge Diagnosis:   Principal Problem:   Chest pain with moderate risk for cardiac etiology Active Problems:   Abnormal ECG   Anemia   Anginal chest pain at rest   Procedures: Exercise stress echocardiogram  Hospital Course: Abigail Jacobson is a 39 y.o. female with no history of CAD. She came into the hospital with chest pain, and had an ECG that was abnormal with no old ones to compare. She was admitted for further evaluation and treatment.  Her cardiac enzymes were negative for MI. Her symptoms finally resolved but she had intermittent pain for several hours. An ischemic evaluation was indicated and exercise stress echocardiogram was performed on 12/27/2014. Although she reached target very early in stage I, she continued exercising into stage III. She reached greater than 100% of her predicted heart rate. She had shortness of breath with the exertion but no chest pain.  Stress echocardiogram results are below. Her ECG was nondiagnostic, but she had vigorous motion with exercise and no wall motion abnormalities. No further ischemic evaluation is indicated.  She was anemic on admission with borderline-low MCV. Iron profile was performed and showed low iron levels. She is to follow up with primary care for this.   Dr Burt Knack evaluated the data and felt no further inpatient workup is indicated. She is considered stable for discharge, to follow up as an outpatient.   Labs:  Lab Results  Component Value Date   WBC 8.2 12/27/2014   HGB 9.3* 12/27/2014   HCT 30.0* 12/27/2014   MCV 78.1 12/27/2014   PLT 365 12/27/2014     Recent Labs Lab 12/26/14 2000 12/27/14 0432 12/27/14 0830  NA 138  --   --   K 4.1  --   --   CL 105  --   --   CO2 25  --   --   BUN 9   --   --   CREATININE 0.86 0.78  --   CALCIUM 9.0  --   --   PROT  --   --  7.0  BILITOT  --   --  0.5  ALKPHOS  --   --  59  ALT  --   --  11  AST  --   --  17  GLUCOSE 103*  --   --    Lab Results  Component Value Date   TROPONINI <0.03 12/27/2014   Lipid Panel     Component Value Date/Time   CHOL 139 12/27/2014 0430   TRIG 56 12/27/2014 0430   HDL 57 12/27/2014 0430   CHOLHDL 2.4 12/27/2014 0430   VLDL 11 12/27/2014 0430   LDLCALC 71 12/27/2014 0430   Anemia Panel: Recent Labs  12/27/14 0432  FERRITIN 8*  TIBC 423  IRON 30*  RETICCTPCT 1.6   B NATRIURETIC PEPTIDE  Date/Time Value Ref Range Status  12/27/2014 04:00 AM 36.3 0.0 - 100.0 pg/mL Final   Lab Results  Component Value Date   TSH 2.028 12/27/2014   Radiology: Dg Chest 2 View 12/26/2014   CLINICAL DATA:  Chest pain for several hr.   Initial encounter.  EXAM: CHEST  2 VIEW  COMPARISON:  09/03/2009  FINDINGS: Normal mediastinum and cardiac silhouette. Normal pulmonary vasculature. No evidence of effusion, infiltrate, or pneumothorax.  No acute bony abnormality.  IMPRESSION: Normal chest radiograph.   Electronically Signed   By: Suzy Bouchard M.D.   On: 12/26/2014 20:36   EKG: 12/27/2014 Sinus rhythm, inferolateral T-wave changes and ST changes that improved slightly overnight  Echo: 12/27/2014 Impressions: - Clinically negative [employer] nongdiagnositc for ischemia Echo with normal vigorous response to exercise. Normal study after maximal exercise.  FOLLOW UP PLANS AND APPOINTMENTS Allergies  Allergen Reactions  . Food Anaphylaxis and Itching    Pistachios     Medication List    STOP taking these medications        ranitidine 150 MG tablet  Commonly known as:  ZANTAC      TAKE these medications        aspirin EC 81 MG tablet  Take 81 mg by mouth daily as needed for mild pain or moderate pain.     cetirizine 10 MG tablet  Commonly known as:  ZYRTEC  Take 10 mg by mouth daily.      COD LIVER PO  Take 1 tablet by mouth daily.     GINKGO BILOBA PO  Take 1 tablet by mouth daily.     montelukast 10 MG tablet  Commonly known as:  SINGULAIR  Take 10 mg by mouth at bedtime.     VITAMIN D PO  Take 1 tablet by mouth daily.        Discharge Instructions    Diet - low sodium heart healthy    Complete by:  As directed      Increase activity slowly    Complete by:  As directed           Follow-up Information    Follow up with Sherren Mocha, MD.   Specialty:  Cardiology   Why:  As needed   Contact information:   [address] [address] [phone]       BRING ALL MEDICATIONS WITH YOU TO FOLLOW UP APPOINTMENTS  Time spent with patient to include physician time: 42 min Signed: Rosaria Ferries, PA-C 01/01/2015, 1:39 PM Co-Sign MD

## 2014-12-27 NOTE — Progress Notes (Signed)
Patient Name: Abigail Jacobson Date of Encounter: 12/27/2014  Principal Problem:   Chest pain with moderate risk for cardiac etiology Active Problems:   Abnormal ECG   Anemia   Primary Cardiologist: New  Patient Profile: 39 yo female w/ obesity as only CRF was admitted w/ chest pain 04/28, initial ez negative, ECG abnl. Pt with anemia and ?OSA. Urine preg and UDS neg.   SUBJECTIVE: Intermittent chest pain, 5" at a time until about 2 am, none since. Was exercising occasionally PTA, walking 30" at a time, has never had this pain with ambulation or exertion.   OBJECTIVE Filed Vitals:   12/26/14 2157 12/26/14 2313 12/27/14 0500 12/27/14 0739  BP: 127/102 139/67 126/81 105/60  Pulse: 97 75 71 73  Temp:  97.8 F (36.6 C) 98.1 F (36.7 C) 98.1 F (36.7 C)  TempSrc:  Oral Oral Oral  Resp: 26 20  18   Height:  5\' 7"  (1.702 m) 5\' 7"  (1.702 m)   Weight:  246 lb (111.585 kg)    SpO2: 97% 100% 100% 100%    Intake/Output Summary (Last 24 hours) at 12/27/14 0929 Last data filed at 12/26/14 2313  Gross per 24 hour  Intake    240 ml  Output      0 ml  Net    240 ml   Filed Weights   12/26/14 1934 12/26/14 2313  Weight: 200 lb (90.719 kg) 246 lb (111.585 kg)    PHYSICAL EXAM General: Well developed, well nourished, female in no acute distress. Head: Normocephalic, atraumatic.  Neck: Supple without bruits, JVD not elevated. Lungs:  Resp regular and unlabored, CTA. Heart: RRR, S1, S2, no S3, S4, 2/6 murmur; no rub. Abdomen: Soft, non-tender, non-distended, BS + x 4.  Extremities: No clubbing, cyanosis, edema.  Neuro: Alert and oriented X 3. Moves all extremities spontaneously. Psych: Normal affect.  LABS: CBC:  Recent Labs  12/26/14 2000 12/27/14 0432  WBC 7.6 8.2  NEUTROABS 4.2  --   HGB 9.4* 9.3*  HCT 30.1* 30.0*  MCV 79.0 78.1  PLT 303 365   INR:No results for input(s): INR in the last 72 hours. Basic Metabolic Panel:  Recent Labs  12/26/14 2000  12/27/14 0432  NA 138  --   K 4.1  --   CL 105  --   CO2 25  --   GLUCOSE 103*  --   BUN 9  --   CREATININE 0.86 0.78  CALCIUM 9.0  --    Liver Function Tests:No results for input(s): AST, ALT, ALKPHOS, BILITOT, PROT, ALBUMIN in the last 72 hours. Cardiac Enzymes:  Recent Labs  12/26/14 2000  TROPONINI <0.03   BNP:  B NATRIURETIC PEPTIDE  Date/Time Value Ref Range Status  12/27/2014 04:00 AM 36.3 0.0 - 100.0 pg/mL Final   No results found for: PROBNP D-dimer:No results for input(s): DDIMER in the last 72 hours. Hemoglobin A1C:No results for input(s): HGBA1C in the last 72 hours. Fasting Lipid Panel:  Recent Labs  12/27/14 0430  CHOL 139  HDL 57  LDLCALC 71  TRIG 56  CHOLHDL 2.4   Thyroid Function Tests:  Recent Labs  12/27/14 0400  TSH 2.028   Anemia Panel:  Recent Labs  12/27/14 0432  RETICCTPCT 1.6   Urinalysis    Component Value Date/Time   COLORURINE YELLOW 09/12/2014 [place] 09/12/2014 2225   LABSPEC 1.005 09/12/2014 2225   PHURINE 6.5 09/12/2014 2225   GLUCOSEU NEGATIVE 09/12/2014 2225  HGBUR NEGATIVE 09/12/2014 [place] 09/12/2014 2225   KETONESUR NEGATIVE 09/12/2014 2225   PROTEINUR NEGATIVE 09/12/2014 2225   UROBILINOGEN 1.0 09/12/2014 2225   NITRITE NEGATIVE 09/12/2014 2225   LEUKOCYTESUR NEGATIVE 09/12/2014 2225   Drugs of Abuse     Component Value Date/Time   LABOPIA NONE DETECTED 12/27/2014 0640   COCAINSCRNUR NONE DETECTED 12/27/2014 0640   LABBENZ NONE DETECTED 12/27/2014 0640   AMPHETMU NONE DETECTED 12/27/2014 0640   THCU NONE DETECTED 12/27/2014 0640   LABBARB NONE DETECTED 12/27/2014 0640     TELE:   SR w/ intermittent ST  Radiology/Studies: Dg Chest 2 View  12/26/2014   CLINICAL DATA:  Chest pain for several hr.   Initial encounter.  EXAM: CHEST  2 VIEW  COMPARISON:  09/03/2009  FINDINGS: Normal mediastinum and cardiac silhouette. Normal pulmonary vasculature. No evidence of  effusion, infiltrate, or pneumothorax. No acute bony abnormality.  IMPRESSION: Normal chest radiograph.   Electronically Signed   By: Suzy Bouchard M.D.   On: 12/26/2014 20:36     Current Medications:  . aspirin EC  81 mg Oral Daily  . enoxaparin (LOVENOX) injection  40 mg Subcutaneous Q24H  . famotidine  20 mg Oral Daily  . montelukast  10 mg Oral QHS  . sodium chloride  3 mL Intravenous Q12H      ASSESSMENT AND PLAN: Principal Problem:   Chest pain with moderate risk for cardiac etiology - review dynamic ECG changes with M.D. - Cath if repeat enzymes positive -  encourage cardiac risk factor reduction  - Patient on aspirin, blood pressure is low as 105 so will not add beta blocker at this time - Cholesterol profile is above, if enzymes are elevated we will add statin and check LFTs   Active Problems:   Abnormal ECG - Review with M.D.     Anemia - Microcytic, likely iron deficient, iron profile pending   Signed, Rosaria Ferries , PA-C 9:29 AM 12/27/2014  Patient seen, examined. Available data reviewed. Agree with findings, assessment, and plan as outlined by Rosaria Ferries, PA-C. Exam reveals a pleasant, alert and oriented woman in no distress. Lung fields are clear. Heart is regular rate and rhythm with a soft systolic ejection murmur at the right upper sternal border. Extremities without edema. EKG is reviewed and there is inferolateral ST segment depression and T-wave inversion. These changes could certainly be seen with ischemia. However, the patient really is at low risk at age 67 with no smoking history or diabetes. Her troponins are normal. I think we should proceed with an exercise stress echocardiogram and if negative it would be reasonable to discharge her home today.  Sherren Mocha, M.D. 12/27/2014 9:56 AM

## 2014-12-27 NOTE — Progress Notes (Signed)
  Echocardiogram Echocardiogram Stress Test has been performed.  Diamond Nickel 12/27/2014, 12:32 PM

## 2014-12-27 NOTE — H&P (Signed)
HPI: Abigail Jacobson is a 39 yo woman with no significant PMH who presented to [facility] with CP.  She states episode began around 5 pm.  She had just laid down because she felt like her legs were a little swollen.  Pain was substernal, tight, mild.  After a handful of minutes, she felt some tightness in her right arm.  At this point she took some aspirin and went for a walk with improvement/resolution of symptoms.  She states pain lasted for a total of about 15 minutes on and off.  Initial work up at [facility] notable for abnormal ECG with inferior and lateral STT changes and negative troponin.  Because of abnormal ECG, call to cardiology for transfer made.    Review of Systems:     Cardiac Review of Systems: {Y] = yes [ ]  = no  Chest Pain [ x   ]  Resting SOB [   ] Exertional SOB  [  ]  Orthopnea [  ]   Pedal Edema [x   ]    Palpitations [  ] Syncope  [  ]   Presyncope [   ]  General Review of Systems: [Y] = yes [  ]=no Constitional: recent weight change [  ]; anorexia [  ]; fatigue [  ]; nausea [  ]; night sweats [  ]; fever [  ]; or chills [  ];                                                                      Dental: poor dentition[x  ];   Eye : blurred vision [  ]; diplopia [   ]; vision changes [  ];  Amaurosis fugax[  ]; Resp: cough [  ];  wheezing[  ];  hemoptysis[  ]; shortness of breath[  ]; paroxysmal nocturnal dyspnea[  ]; dyspnea on exertion[  ]; or orthopnea[  ];  GI:  gallstones[  ], vomiting[  ];  dysphagia[  ]; melena[  ];  hematochezia [  ]; heartburn[  ];   GU: kidney stones [  ]; hematuria[  ];   dysuria [  ];  nocturia[  ];               Skin: rash [  ], swelling[  ];, hair loss[  ];  peripheral edema[  ];  or itching[  ]; Musculosketetal: myalgias[  ];  joint swelling[  ];  joint erythema[  ];  joint pain[  ];  back pain[  ];  Heme/Lymph: bruising[  ];  bleeding[  ];  anemia[  ];  Neuro: TIA[  ];  headaches[  ];  stroke[  ];  vertigo[  ];  seizures[  ];   paresthesias[  ];   difficulty walking[  ];  Psych:depression[  ]; anxiety[  ];  Endocrine: diabetes[  ];  thyroid dysfunction[  ];  Other: positive for snoring and apnea  Past Medical History  Diagnosis Date  . Gastric ulcer   . Migraines   . Fibroid   . Tonsillitis     No current facility-administered medications on file prior to encounter.   Current Outpatient Prescriptions on File Prior to Encounter  Medication Sig Dispense Refill  .  ranitidine (ZANTAC) 150 MG tablet Take 1 tablet (150 mg total) by mouth 2 (two) times daily. 60 tablet 0     Allergies  Allergen Reactions  . Other Anaphylaxis and Itching    PISTACHIOS     History   Social History  . Marital Status: Single    Spouse Name: N/A  . Number of Children: N/A  . Years of Education: N/A   Occupational History  . propert manager    Social History Main Topics  . Smoking status: Never Smoker   . Smokeless tobacco: Never Used  . Alcohol Use: Yes     Comment: occassionally  . Drug Use: No  . Sexual Activity: No   Other Topics Concern  . Not on file   Social History Narrative    Family History  Problem Relation Age of Onset  . Hypertension Mother   . Stroke Maternal Grandmother   . Stroke Maternal Grandfather     PHYSICAL EXAM: Filed Vitals:   12/26/14 2313  BP: 139/67  Pulse: 75  Temp: 97.8 F (36.6 C)  Resp: 20   General:  Well appearing. No respiratory difficulty HEENT: normal Neck: supple. no JVD. Carotids 2+ bilat; no bruits. No lymphadenopathy or thryomegaly appreciated. Cor: PMI nondisplaced. Regular rate & rhythm. No rubs, gallops, + 1/6 systolic murmur. Lungs: clear Abdomen: soft, nontender, nondistended. No hepatosplenomegaly. No bruits or masses. Good bowel sounds. Extremities: no cyanosis, clubbing, rash, edema Neuro: alert & oriented x 3, cranial nerves grossly intact. moves all 4 extremities w/o difficulty. Affect pleasant.  ECG: SR with inferior and anterolateral STT abnormality.  Repeat  upon arrival here shows SR with TWI anterior leads.  Results for orders placed or performed during the hospital encounter of 12/26/14 (from the past 24 hour(s))  Basic metabolic panel     Status: Abnormal   Collection Time: 12/26/14  8:00 PM  Result Value Ref Range   Sodium 138 135 - 145 mmol/L   Potassium 4.1 3.5 - 5.1 mmol/L   Chloride 105 96 - 112 mmol/L   CO2 25 19 - 32 mmol/L   Glucose, Bld 103 (H) 70 - 99 mg/dL   BUN 9 6 - 23 mg/dL   Creatinine, Ser 0.86 0.50 - 1.10 mg/dL   Calcium 9.0 8.4 - 10.5 mg/dL   GFR calc non Af Amer 85 (L) >90 mL/min   GFR calc Af Amer >90 >90 mL/min   Anion gap 8 5 - 15  CBC with Differential/Platelet     Status: Abnormal   Collection Time: 12/26/14  8:00 PM  Result Value Ref Range   WBC 7.6 4.0 - 10.5 K/uL   RBC 3.81 (L) 3.87 - 5.11 MIL/uL   Hemoglobin 9.4 (L) 12.0 - 15.0 g/dL   HCT 30.1 (L) 36.0 - 46.0 %   MCV 79.0 78.0 - 100.0 fL   MCH 24.7 (L) 26.0 - 34.0 pg   MCHC 31.2 30.0 - 36.0 g/dL   RDW 15.2 11.5 - 15.5 %   Platelets 303 150 - 400 K/uL   Neutrophils Relative % 55 43 - 77 %   Neutro Abs 4.2 1.7 - 7.7 K/uL   Lymphocytes Relative 35 12 - 46 %   Lymphs Abs 2.6 0.7 - 4.0 K/uL   Monocytes Relative 9 3 - 12 %   Monocytes Absolute 0.7 0.1 - 1.0 K/uL   Eosinophils Relative 1 0 - 5 %   Eosinophils Absolute 0.1 0.0 - 0.7 K/uL   Basophils Relative 0  0 - 1 %   Basophils Absolute 0.0 0.0 - 0.1 K/uL  Troponin I     Status: None   Collection Time: 12/26/14  8:00 PM  Result Value Ref Range   Troponin I <0.03 <0.031 ng/mL   Dg Chest 2 View  12/26/2014   CLINICAL DATA:  Chest pain for several hr.   Initial encounter.  EXAM: CHEST  2 VIEW  COMPARISON:  09/03/2009  FINDINGS: Normal mediastinum and cardiac silhouette. Normal pulmonary vasculature. No evidence of effusion, infiltrate, or pneumothorax. No acute bony abnormality.  IMPRESSION: Normal chest radiograph.   Electronically Signed   By: Suzy Bouchard M.D.   On: 12/26/2014 20:36      ASSESSMENT: 39 yo woman with no significant PMH who presents as transfer from [facility] for episode of CP and abnormal ECG.  Overall she is low risk for ACS and initial troponin is negative.  However, her ECG is abnormal and ischemia is in the differential.  In a young woman of child bearing age, coronary dissection is possible related to child bearing or spontaneous but feel this is less likely in this situation.  She has a soft systolic murmur, which, in a young woman with anemia I suspect represents a flow murmur.  As for her anemia, it appears near microcytic and likely iron deficiency anemia.   PLAN/DISCUSSION: Admit under observation Cycle troponins Risk stratify with A1c and lipids Check echo in AM given abnormal ECG and murmur on exam Retic count and iron profile TSH Recommended pursuing polysomnography that is planned as OP as she clinically has OSA Further recommendations based on above results

## 2014-12-27 NOTE — Progress Notes (Signed)
UR completed 

## 2014-12-27 NOTE — Progress Notes (Signed)
GXT Echo performed. Target HR reached stage I.  Final report pending.

## 2014-12-29 ENCOUNTER — Other Ambulatory Visit: Payer: Self-pay | Admitting: Physician Assistant

## 2014-12-29 DIAGNOSIS — R0789 Other chest pain: Secondary | ICD-10-CM

## 2014-12-29 DIAGNOSIS — R011 Cardiac murmur, unspecified: Secondary | ICD-10-CM

## 2014-12-30 LAB — HEMOGLOBIN A1C
Hgb A1c MFr Bld: 6.2 % — ABNORMAL HIGH (ref 4.8–5.6)
Mean Plasma Glucose: 131 mg/dL

## 2015-03-06 ENCOUNTER — Emergency Department (HOSPITAL_BASED_OUTPATIENT_CLINIC_OR_DEPARTMENT_OTHER)
Admission: EM | Admit: 2015-03-06 | Discharge: 2015-03-06 | Disposition: A | Discharge status: Home / Self Care | Payer: Self-pay | Attending: Emergency Medicine | Admitting: Emergency Medicine

## 2015-03-06 ENCOUNTER — Encounter (HOSPITAL_BASED_OUTPATIENT_CLINIC_OR_DEPARTMENT_OTHER): Payer: Self-pay

## 2015-03-06 DIAGNOSIS — J9801 Acute bronchospasm: Secondary | ICD-10-CM

## 2015-03-06 DIAGNOSIS — Z8679 Personal history of other diseases of the circulatory system: Secondary | ICD-10-CM | POA: Insufficient documentation

## 2015-03-06 DIAGNOSIS — Z79899 Other long term (current) drug therapy: Secondary | ICD-10-CM | POA: Insufficient documentation

## 2015-03-06 DIAGNOSIS — J45901 Unspecified asthma with (acute) exacerbation: Secondary | ICD-10-CM | POA: Insufficient documentation

## 2015-03-06 DIAGNOSIS — Z7982 Long term (current) use of aspirin: Secondary | ICD-10-CM | POA: Insufficient documentation

## 2015-03-06 DIAGNOSIS — Z8719 Personal history of other diseases of the digestive system: Secondary | ICD-10-CM | POA: Insufficient documentation

## 2015-03-06 DIAGNOSIS — Z86018 Personal history of other benign neoplasm: Secondary | ICD-10-CM | POA: Insufficient documentation

## 2015-03-06 HISTORY — DX: Unspecified asthma, uncomplicated: J45.909

## 2015-03-06 MED ORDER — PREDNISONE 50 MG PO TABS
60.0000 mg | ORAL_TABLET | Freq: Once | ORAL | Status: AC
  Administered 2015-03-06: 60 mg via ORAL
  Filled 2015-03-06 (×2): qty 1

## 2015-03-06 MED ORDER — PREDNISONE 20 MG PO TABS: 40.0000 mg | ORAL_TABLET | Freq: Every day | ORAL | Status: DC

## 2015-03-06 MED ORDER — CETIRIZINE HCL 10 MG PO TABS: 10.0000 mg | ORAL_TABLET | Freq: Every day | ORAL | Status: DC

## 2015-03-06 NOTE — Discharge Instructions (Signed)
Please read and follow all provided instructions.  Your diagnoses today include:  1. Bronchospasm    Tests performed today include:  Vital signs. See below for your results today.   Medications prescribed:   Prednisone - steroid medicine   It is best to take this medication in the morning to prevent sleeping problems. If you are diabetic, monitor your blood sugar closely and stop taking Prednisone if blood sugar is over 300. Take with food to prevent stomach upset.   Take any prescribed medications only as directed.  Home care instructions:  Follow any educational materials contained in this packet.  Follow-up instructions: Please follow-up with your primary care provider in the next 3 days for further evaluation of your symptoms and a recheck if you are not feeling better.   Return instructions:   Please return to the Emergency Department if you experience worsening symptoms.  Please return with worsening wheezing, shortness of breath, or difficulty breathing.  Return with persistent fever above 101F.   Please return if you have any other emergent concerns.  Additional Information:  Your vital signs today were: BP 128/73 mmHg   Pulse 94   Temp(Src) 98.3 F (36.8 C) (Oral)   Resp 20   Ht 5\' 7"  (1.702 m)   Wt 247 lb (112.038 kg)   BMI 38.68 kg/m2   SpO2 100%   LMP 02/20/2015 If your blood pressure (BP) was elevated above 135/85 this visit, please have this repeated by your doctor within one month. --------------

## 2015-03-06 NOTE — ED Notes (Signed)
States wheezing has been present x 2 weeks and she has increased use of ProAir from 1 puff q/day to 2 puffs/qday

## 2015-03-06 NOTE — ED Notes (Signed)
C/o chest congestion x 1 month-dx with asthma in [facility] and stared on proair inhaler-pt states she is now having increase in wheezing-NAD

## 2015-03-06 NOTE — ED Provider Notes (Signed)
CSN: [id number]     Arrival date & time 03/06/15  1343 History   First MD Initiated Contact with Patient 03/06/15 1448     Chief Complaint  Patient presents with  . Chest congestion     (Consider location/radiation/quality/duration/timing/severity/associated sxs/prior Treatment) HPI Comments: Patient presents with complaint of wheezing but has been worse over the past 1 week. Patient has had episodes of wheezing over the past year for which she has seen her physician. She was started on albuterol, Singular, and Zyrtec. Patient ran out of the Zyrtec and Singulair approximately 2 weeks ago. Symptoms became worse after this. Patient does receive some relief from albuterol inhaler. Symptoms are worse at night. Otherwise no URI symptoms including runny nose, sore throat. Patient has not had fever or productive cough. No previous history of asthma or bronchitis. Patient does not smoke. No new lung irritant exposures. Onset of symptoms acute. Course is constant. Nothing makes symptoms better. Activity makes the symptoms worse.  The history is provided by the patient.    Past Medical History  Diagnosis Date  . Gastric ulcer   . Migraines   . Fibroid   . Tonsillitis   . Asthma    History reviewed. No pertinent past surgical history. Family History  Problem Relation Age of Onset  . Hypertension Mother   . Stroke Maternal Grandmother   . Stroke Maternal Grandfather    History  Substance Use Topics  . Smoking status: Never Smoker   . Smokeless tobacco: Never Used  . Alcohol Use: Yes     Comment: occassionally   OB History    Gravida Para Term Preterm AB TAB SAB Ectopic Multiple Living   0 0 0 0 0 0 0 0 0 0      Review of Systems  Constitutional: Negative for fever.  HENT: Negative for congestion, rhinorrhea and sore throat.   Eyes: Negative for redness.  Respiratory: Positive for cough and wheezing. Negative for shortness of breath.   Cardiovascular: Negative for chest pain.   Gastrointestinal: Negative for nausea, vomiting, abdominal pain and diarrhea.  Genitourinary: Negative for dysuria.  Musculoskeletal: Negative for myalgias.  Skin: Negative for rash.  Neurological: Negative for headaches.      Allergies  Food  Home Medications   Prior to Admission medications   Medication Sig Start Date End Date Taking? Authorizing Provider  albuterol (PROVENTIL HFA;VENTOLIN HFA) 108 (90 BASE) MCG/ACT inhaler Inhale into the lungs every 6 (six) hours as needed for wheezing or shortness of breath.   Yes Historical Provider, MD  aspirin EC 81 MG tablet Take 81 mg by mouth daily as needed for mild pain or moderate pain.    Historical Provider, MD  cetirizine (ZYRTEC) 10 MG tablet Take 10 mg by mouth daily.    Historical Provider, MD  Cholecalciferol (VITAMIN D PO) Take 1 tablet by mouth daily.    Historical Provider, MD  [facility] Liver Oil (COD LIVER PO) Take 1 tablet by mouth daily.    Historical Provider, MD  GINKGO BILOBA PO Take 1 tablet by mouth daily.    Historical Provider, MD  montelukast (SINGULAIR) 10 MG tablet Take 10 mg by mouth at bedtime.    Historical Provider, MD   BP 128/73 mmHg  Pulse 94  Temp(Src) 98.3 F (36.8 C) (Oral)  Resp 20  Ht 5\' 7"  (1.702 m)  Wt 247 lb (112.038 kg)  BMI 38.68 kg/m2  SpO2 100%  LMP 02/20/2015 Physical Exam  Constitutional: She appears well-developed and well-nourished.  HENT:  Head: Normocephalic and atraumatic.  Right Ear: Tympanic membrane, external ear and ear canal normal.  Left Ear: Tympanic membrane, external ear and ear canal normal.  Nose: Nose normal. No mucosal edema or rhinorrhea.  Mouth/Throat: Uvula is midline, oropharynx is clear and moist and mucous membranes are normal. Mucous membranes are not dry. No oral lesions. No trismus in the jaw. No uvula swelling. No oropharyngeal exudate, posterior oropharyngeal edema, posterior oropharyngeal erythema or tonsillar abscesses.  Eyes: Conjunctivae are normal. Right  eye exhibits no discharge. Left eye exhibits no discharge.  Neck: Normal range of motion. Neck supple.  Cardiovascular: Normal rate, regular rhythm and normal heart sounds.   Pulmonary/Chest: Effort normal and breath sounds normal. No respiratory distress. She has no wheezes. She has no rales.  Abdominal: Soft. There is no tenderness.  Lymphadenopathy:    She has no cervical adenopathy.  Neurological: She is alert.  Skin: Skin is warm and dry.  Psychiatric: She has a normal mood and affect.  Nursing note and vitals reviewed.   ED Course  Procedures (including critical care time) Labs Review Labs Reviewed - No data to display  Imaging Review No results found.   EKG Interpretation None       Patient seen and examined. Medications ordered.   Vital signs reviewed and are as follows: BP 128/73 mmHg  Pulse 94  Temp(Src) 98.3 F (36.8 C) (Oral)  Resp 20  Ht 5\' 7"  (1.702 m)  Wt 247 lb (112.038 kg)  BMI 38.68 kg/m2  SpO2 100%  LMP 02/20/2015  Given reported bronchospasm symptoms, will treat with a burst of prednisone. Patient has prescription for Singulair waiting at her pharmacy. She will continue albuterol. Encouraged PCP follow-up for further treatment and management of her bronchospasm  MDM   Final diagnoses:  Bronchospasm   No fever or other systemic symptoms to suggest pneumonia. Lungs are clear. Patient reports wheezing especially at nighttime. Will treat patient for bronchospasm, asthma/bronchitis exacerbation. Patient appears well, nontoxic. Do not feel further workup is needed at this time. Patient has appropriate PCP follow-up in [place], [place].   Carlisle Cater, PA-C 03/06/15 1556  Sherwood Gambler, MD 03/06/15 [phone]

## 2015-07-03 ENCOUNTER — Encounter (HOSPITAL_BASED_OUTPATIENT_CLINIC_OR_DEPARTMENT_OTHER): Payer: Self-pay | Admitting: Emergency Medicine

## 2015-07-03 ENCOUNTER — Emergency Department (HOSPITAL_BASED_OUTPATIENT_CLINIC_OR_DEPARTMENT_OTHER)
Admission: EM | Admit: 2015-07-03 | Discharge: 2015-07-03 | Disposition: A | Discharge status: Home / Self Care | Payer: Self-pay | Attending: Emergency Medicine | Admitting: Emergency Medicine

## 2015-07-03 DIAGNOSIS — Y9289 Other specified places as the place of occurrence of the external cause: Secondary | ICD-10-CM | POA: Insufficient documentation

## 2015-07-03 DIAGNOSIS — Z23 Encounter for immunization: Secondary | ICD-10-CM | POA: Insufficient documentation

## 2015-07-03 DIAGNOSIS — Z8679 Personal history of other diseases of the circulatory system: Secondary | ICD-10-CM | POA: Insufficient documentation

## 2015-07-03 DIAGNOSIS — J45909 Unspecified asthma, uncomplicated: Secondary | ICD-10-CM | POA: Insufficient documentation

## 2015-07-03 DIAGNOSIS — S0501XA Injury of conjunctiva and corneal abrasion without foreign body, right eye, initial encounter: Secondary | ICD-10-CM | POA: Insufficient documentation

## 2015-07-03 DIAGNOSIS — Z7982 Long term (current) use of aspirin: Secondary | ICD-10-CM | POA: Insufficient documentation

## 2015-07-03 DIAGNOSIS — Y998 Other external cause status: Secondary | ICD-10-CM | POA: Insufficient documentation

## 2015-07-03 DIAGNOSIS — Y9389 Activity, other specified: Secondary | ICD-10-CM | POA: Insufficient documentation

## 2015-07-03 DIAGNOSIS — X58XXXA Exposure to other specified factors, initial encounter: Secondary | ICD-10-CM | POA: Insufficient documentation

## 2015-07-03 DIAGNOSIS — Z79899 Other long term (current) drug therapy: Secondary | ICD-10-CM | POA: Insufficient documentation

## 2015-07-03 DIAGNOSIS — Z86018 Personal history of other benign neoplasm: Secondary | ICD-10-CM | POA: Insufficient documentation

## 2015-07-03 DIAGNOSIS — Z8719 Personal history of other diseases of the digestive system: Secondary | ICD-10-CM | POA: Insufficient documentation

## 2015-07-03 MED ORDER — TETANUS-DIPHTH-ACELL PERTUSSIS 5-2.5-18.5 LF-MCG/0.5 IM SUSP
0.5000 mL | Freq: Once | INTRAMUSCULAR | Status: AC
  Administered 2015-07-03: 0.5 mL via INTRAMUSCULAR
  Filled 2015-07-03: qty 0.5

## 2015-07-03 MED ORDER — FLUORESCEIN SODIUM 1 MG OP STRP
1.0000 | ORAL_STRIP | Freq: Once | OPHTHALMIC | Status: AC
  Administered 2015-07-03: 1 via OPHTHALMIC
  Filled 2015-07-03: qty 1

## 2015-07-03 MED ORDER — TOBRAMYCIN 0.3 % OP SOLN: 1.0000 [drp] | OPHTHALMIC | Status: DC

## 2015-07-03 NOTE — ED Notes (Signed)
Pt feels like something is in her right eye.  Pt having pain, blurry vision and watering of right eye since Tuesday.

## 2015-07-03 NOTE — ED Notes (Signed)
MD at bedside. 

## 2015-07-03 NOTE — ED Notes (Signed)
Pt updated on referral information: Dr. Tama High at [facility] in [place], phone [phone]. Pt states she will call today and make an apt for Monday.

## 2015-07-03 NOTE — Discharge Instructions (Signed)
Corneal Abrasion Call Dr Manuella Ghazi scheduled office appointment if your eyes not improved by Monday, 07/07/2015 The cornea is the clear covering at the front and center of the eye. When you look at the colored portion of the eye, you are looking through the cornea. It is a thin tissue made up of layers. The top layer is the most sensitive layer. A corneal abrasion happens if this layer is scratched or an injury causes it to come off.  HOME CARE  You may be given drops or a medicated cream. Use the medicine as told by your doctor.  A pressure patch may be put over the eye. If this is done, follow your doctor's instructions for when to remove the patch. Do not drive or use machines while the eye patch is on. Judging distances is hard to do with a patch on.  See your doctor for a follow-up exam if you are told to do so. It is very important that you keep this appointment. GET HELP IF:   You have pain, are sensitive to light, and have a scratchy feeling in one eye or both eyes.  Your pressure patch keeps getting loose. You can blink your eye under the patch.  You have fluid coming from your eye or the lids stick together in the morning.  You have the same symptoms in the morning that you did with the first abrasion. This could be days, weeks, or months after the first abrasion healed.   This information is not intended to replace advice given to you by your health care provider. Make sure you discuss any questions you have with your health care provider.   Document Released: 02/02/2008 Document Revised: 05/07/2015 Document Reviewed: 04/23/2013 Elsevier Interactive Patient Education [employer].

## 2015-07-03 NOTE — ED Provider Notes (Signed)
CSN: [id number]     Arrival date & time 07/03/15  8466 History   First MD Initiated Contact with Patient 07/03/15 0914     Chief Complaint  Patient presents with  . Eye Pain     (Consider location/radiation/quality/duration/timing/severity/associated sxs/prior Treatment) HPI Complains of foreign body sensation in right eye for the past 2 days. She states that an eyelash went into her eye and she's had trouble getting it out. Pain is improved with keeping her eye closed. Worse with opening her eye. Associated symptoms include mild blurred vision, and watery discharge from right eye. No fever. No other associated symptoms. No treatment prior to coming here Past Medical History  Diagnosis Date  . Gastric ulcer   . Migraines   . Fibroid   . Tonsillitis   . Asthma    No past surgical history on file. Family History  Problem Relation Age of Onset  . Hypertension Mother   . Stroke Maternal Grandmother   . Stroke Maternal Grandfather    Social History  Substance Use Topics  . Smoking status: Never Smoker   . Smokeless tobacco: Never Used  . Alcohol Use: Yes     Comment: occassionally   denies alcohol use OB History    Gravida Para Term Preterm AB TAB SAB Ectopic Multiple Living   0 0 0 0 0 0 0 0 0 0      Review of Systems  Constitutional: Negative.   Eyes: Positive for pain, discharge, redness and visual disturbance.      Allergies  Food  Home Medications   Prior to Admission medications   Medication Sig Start Date End Date Taking? Authorizing Provider  albuterol (PROVENTIL HFA;VENTOLIN HFA) 108 (90 BASE) MCG/ACT inhaler Inhale into the lungs every 6 (six) hours as needed for wheezing or shortness of breath.    Historical Provider, MD  aspirin EC 81 MG tablet Take 81 mg by mouth daily as needed for mild pain or moderate pain.    Historical Provider, MD  Cholecalciferol (VITAMIN D PO) Take 1 tablet by mouth daily.    Historical Provider, MD  [facility] Liver Oil (COD LIVER PO)  Take 1 tablet by mouth daily.    Historical Provider, MD  GINKGO BILOBA PO Take 1 tablet by mouth daily.    Historical Provider, MD   BP 120/48 mmHg  Pulse 69  Temp(Src) 99.7 F (37.6 C) (Oral)  Resp 16  Ht 5\' 7"  (1.702 m)  Wt 243 lb (110.224 kg)  BMI 38.05 kg/m2  SpO2 100%  LMP 06/19/2015 Physical Exam  Constitutional: She is oriented to person, place, and time. She appears well-developed and well-nourished. No distress.  HENT:  Head: Normocephalic.  Right Ear: External ear normal.  Left Ear: External ear normal.  Eyes: EOM are normal. Pupils are equal, round, and reactive to light.  Right eye minimally reddened with slight watery discharge. Eyelids everted. No foreign body seen. No pain on extraocular movement. No obvious foreseen uptake. Left eye normal  Cardiovascular: Normal rate.   Pulmonary/Chest: Effort normal.  Abdominal:  Obese  Neurological: She is oriented to person, place, and time. No cranial nerve deficit.  Skin: Skin is warm and dry. No rash noted.  Nursing note and vitals reviewed.   ED Course  Procedures (including critical care time) Labs Review Labs Reviewed - No data to display  Imaging Review No results found. I have personally reviewed and evaluated these images and lab results as part of my medical decision-making.  EKG Interpretation None      MDM  I suspect patient has tiny corneal abrasion versus conjunctivitis. No foreign body detected. Strongly doubt glaucoma with no systemic symptoms. Foreign body sensation pain improved with closing her eye. Final diagnoses:  None   plan prescription tobramycin ophthalmic solution.Marland Kitchen Ophthalmology referral not better in 4 days Diagnosis right eye pain      Orlie Dakin, MD 07/03/15 [phone]

## 2015-07-03 NOTE — ED Notes (Signed)
Pt presents with rt eye pain, swelling, and redness

## 2015-08-05 ENCOUNTER — Other Ambulatory Visit: Payer: Self-pay

## 2015-08-05 ENCOUNTER — Emergency Department (HOSPITAL_BASED_OUTPATIENT_CLINIC_OR_DEPARTMENT_OTHER): Payer: Self-pay

## 2015-08-05 ENCOUNTER — Encounter (HOSPITAL_BASED_OUTPATIENT_CLINIC_OR_DEPARTMENT_OTHER): Payer: Self-pay | Admitting: *Deleted

## 2015-08-05 ENCOUNTER — Emergency Department (HOSPITAL_BASED_OUTPATIENT_CLINIC_OR_DEPARTMENT_OTHER)
Admission: EM | Admit: 2015-08-05 | Discharge: 2015-08-05 | Disposition: A | Discharge status: Home / Self Care | Payer: Self-pay | Attending: Emergency Medicine | Admitting: Emergency Medicine

## 2015-08-05 DIAGNOSIS — Z8679 Personal history of other diseases of the circulatory system: Secondary | ICD-10-CM | POA: Insufficient documentation

## 2015-08-05 DIAGNOSIS — Z8719 Personal history of other diseases of the digestive system: Secondary | ICD-10-CM | POA: Insufficient documentation

## 2015-08-05 DIAGNOSIS — J45909 Unspecified asthma, uncomplicated: Secondary | ICD-10-CM | POA: Insufficient documentation

## 2015-08-05 DIAGNOSIS — Z79899 Other long term (current) drug therapy: Secondary | ICD-10-CM | POA: Insufficient documentation

## 2015-08-05 DIAGNOSIS — R079 Chest pain, unspecified: Secondary | ICD-10-CM | POA: Insufficient documentation

## 2015-08-05 DIAGNOSIS — Z7982 Long term (current) use of aspirin: Secondary | ICD-10-CM | POA: Insufficient documentation

## 2015-08-05 DIAGNOSIS — R011 Cardiac murmur, unspecified: Secondary | ICD-10-CM | POA: Insufficient documentation

## 2015-08-05 DIAGNOSIS — Z86018 Personal history of other benign neoplasm: Secondary | ICD-10-CM | POA: Insufficient documentation

## 2015-08-05 HISTORY — DX: Cardiac murmur, unspecified: R01.1

## 2015-08-05 LAB — CBC WITH DIFFERENTIAL/PLATELET
Basophils Absolute: 0 10*3/uL (ref 0.0–0.1)
Basophils Relative: 0 %
Eosinophils Absolute: 0 10*3/uL (ref 0.0–0.7)
Eosinophils Relative: 0 %
HCT: 32.6 % — ABNORMAL LOW (ref 36.0–46.0)
Hemoglobin: 10.3 g/dL — ABNORMAL LOW (ref 12.0–15.0)
Lymphocytes Relative: 28 %
Lymphs Abs: 2 10*3/uL (ref 0.7–4.0)
MCH: 24.3 pg — ABNORMAL LOW (ref 26.0–34.0)
MCHC: 31.6 g/dL (ref 30.0–36.0)
MCV: 77.1 fL — ABNORMAL LOW (ref 78.0–100.0)
Monocytes Absolute: 0.5 10*3/uL (ref 0.1–1.0)
Monocytes Relative: 8 %
Neutro Abs: 4.5 10*3/uL (ref 1.7–7.7)
Neutrophils Relative %: 64 %
Platelets: 338 10*3/uL (ref 150–400)
RBC: 4.23 MIL/uL (ref 3.87–5.11)
RDW: 16.6 % — ABNORMAL HIGH (ref 11.5–15.5)
WBC: 7.1 10*3/uL (ref 4.0–10.5)

## 2015-08-05 LAB — BASIC METABOLIC PANEL
Anion gap: 7 (ref 5–15)
BUN: 9 mg/dL (ref 6–20)
CO2: 27 mmol/L (ref 22–32)
Calcium: 9.4 mg/dL (ref 8.9–10.3)
Chloride: 102 mmol/L (ref 101–111)
Creatinine, Ser: 0.76 mg/dL (ref 0.44–1.00)
GFR calc Af Amer: >60 mL/min (ref >60)
GFR calc non Af Amer: >60 mL/min (ref >60)
Glucose, Bld: 98 mg/dL (ref 65–99)
Potassium: 3.7 mmol/L (ref 3.5–5.1)
Sodium: 136 mmol/L (ref 135–145)

## 2015-08-05 LAB — TROPONIN I: Troponin I: <0.03 ng/mL (ref <0.031)

## 2015-08-05 NOTE — ED Provider Notes (Signed)
CSN: [id number]     Arrival date & time 08/05/15  1248 History   First MD Initiated Contact with Patient 08/05/15 1330     Chief Complaint  Patient presents with  . Chest Pain     (Consider location/radiation/quality/duration/timing/severity/associated sxs/prior Treatment) HPI Comments: Patient is a 39 year old female with no significant past medical history. She presents for evaluation of chest discomfort. She reports having heaviness in her chest for the past several days. The pain occurs intermittently and there are no aggravating or alleviating factors. She denies any shortness of breath, nausea, diaphoresis.  She has no cardiac risk factors and was admitted in April of this year for chest pain. She underwent a stress test which was normal and she was discharged to home.  Patient is a 39 y.o. female presenting with chest pain. The history is provided by the patient.  Chest Pain Pain location:  Substernal area Pain quality: pressure   Pain radiates to:  Does not radiate Pain radiates to the back: no   Pain severity:  Moderate Duration:  3 days Timing:  Intermittent Progression:  Worsening Chronicity:  New Relieved by:  Nothing Worsened by:  Nothing tried Ineffective treatments:  None tried Associated symptoms: no abdominal pain     Past Medical History  Diagnosis Date  . Gastric ulcer   . Migraines   . Fibroid   . Tonsillitis   . Asthma   . Heart murmur    History reviewed. No pertinent past surgical history. Family History  Problem Relation Age of Onset  . Hypertension Mother   . Stroke Maternal Grandmother   . Stroke Maternal Grandfather    Social History  Substance Use Topics  . Smoking status: Never Smoker   . Smokeless tobacco: Never Used  . Alcohol Use: Yes     Comment: occassionally   OB History    Gravida Para Term Preterm AB TAB SAB Ectopic Multiple Living   0 0 0 0 0 0 0 0 0 0      Review of Systems  Cardiovascular: Positive for chest pain.   Gastrointestinal: Negative for abdominal pain.      Allergies  Food  Home Medications   Prior to Admission medications   Medication Sig Start Date End Date Taking? Authorizing Provider  albuterol (PROVENTIL HFA;VENTOLIN HFA) 108 (90 BASE) MCG/ACT inhaler Inhale into the lungs every 6 (six) hours as needed for wheezing or shortness of breath.    Historical Provider, MD  aspirin EC 81 MG tablet Take 81 mg by mouth daily as needed for mild pain or moderate pain.    Historical Provider, MD  Cholecalciferol (VITAMIN D PO) Take 1 tablet by mouth daily.    Historical Provider, MD  [facility] Liver Oil (COD LIVER PO) Take 1 tablet by mouth daily.    Historical Provider, MD  GINKGO BILOBA PO Take 1 tablet by mouth daily.    Historical Provider, MD  tobramycin (TOBREX) 0.3 % ophthalmic solution Place 1 drop into the right eye every 4 (four) hours. 07/03/15   Orlie Dakin, MD   BP 140/86 mmHg  Pulse 94  Temp(Src) 98.1 F (36.7 C) (Oral)  Resp 18  Ht 5\' 7"  (1.702 m)  Wt 243 lb (110.224 kg)  BMI 38.05 kg/m2  SpO2 100%  LMP 07/22/2015 Physical Exam  ED Course  Procedures (including critical care time) Labs Review Labs Reviewed - No data to display  Imaging Review No results found. I have personally reviewed and evaluated these images and  lab results as part of my medical decision-making.   EKG Interpretation   Date/Time:  Tuesday August 05 2015 12:51:58 EST Ventricular Rate:  100 PR Interval:  126 QRS Duration: 86 QT Interval:  338 QTC Calculation: 436 R Axis:   61 Text Interpretation:  Normal sinus rhythm ST \\T \ T wave abnormality,  consider inferior ischemia ST \\T \ T wave abnormality, consider anterior  ischemia Abnormal ECG Confirmed by Ayane Delancey  MD, Willodene Stallings ([id number]) on 08/05/2015  1:32:17 PM      MDM   Final diagnoses:  None    Patient is a 39 year old female who presents with complaints of chest discomfort. Her symptoms are atypical for cardiac pain and today's workup is  unremarkable. She does have nonspecific T-wave abnormalities on her EKG, however these are old and unchanged from her last EKG in April. She was admitted that time and underwent a normal stress test. I have a low suspicion for a cardiac etiology and strongly favor a musculoskeletal etiology. Will recommend anti-inflammatory's, rest, and when necessary return.    Veryl Speak, MD 08/05/15 1450

## 2015-08-05 NOTE — Discharge Instructions (Signed)
Ibuprofen or Motrin 600 mg 3 times daily for the next 5 days.  Rest.  Return to the emergency department if your symptoms significantly worsen or change, and follow-up with your primary Dr. if not improving in the few days.   Nonspecific Chest Pain  Chest pain can be caused by many different conditions. There is always a chance that your pain could be related to something serious, such as a heart attack or a blood clot in your lungs. Chest pain can also be caused by conditions that are not life-threatening. If you have chest pain, it is very important to follow up with your health care provider. CAUSES  Chest pain can be caused by:  Heartburn.  Pneumonia or bronchitis.  Anxiety or stress.  Inflammation around your heart (pericarditis) or lung (pleuritis or pleurisy).  A blood clot in your lung.  A collapsed lung (pneumothorax). It can develop suddenly on its own (spontaneous pneumothorax) or from trauma to the chest.  Shingles infection (varicella-zoster virus).  Heart attack.  Damage to the bones, muscles, and cartilage that make up your chest wall. This can include:  Bruised bones due to injury.  Strained muscles or cartilage due to frequent or repeated coughing or overwork.  Fracture to one or more ribs.  Sore cartilage due to inflammation (costochondritis). RISK FACTORS  Risk factors for chest pain may include:  Activities that increase your risk for trauma or injury to your chest.  Respiratory infections or conditions that cause frequent coughing.  Medical conditions or overeating that can cause heartburn.  Heart disease or family history of heart disease.  Conditions or health behaviors that increase your risk of developing a blood clot.  Having had chicken pox (varicella zoster). SIGNS AND SYMPTOMS Chest pain can feel like:  Burning or tingling on the surface of your chest or deep in your chest.  Crushing, pressure, aching, or squeezing pain.  Dull or  sharp pain that is worse when you move, cough, or take a deep breath.  Pain that is also felt in your back, neck, shoulder, or arm, or pain that spreads to any of these areas. Your chest pain may come and go, or it may stay constant. DIAGNOSIS Lab tests or other studies may be needed to find the cause of your pain. Your health care provider may have you take a test called an ambulatory ECG (electrocardiogram). An ECG records your heartbeat patterns at the time the test is performed. You may also have other tests, such as:  Transthoracic echocardiogram (TTE). During echocardiography, sound waves are used to create a picture of all of the heart structures and to look at how blood flows through your heart.  Transesophageal echocardiogram (TEE).This is a more advanced imaging test that obtains images from inside your body. It allows your health care provider to see your heart in finer detail.  Cardiac monitoring. This allows your health care provider to monitor your heart rate and rhythm in real time.  Holter monitor. This is a portable device that records your heartbeat and can help to diagnose abnormal heartbeats. It allows your health care provider to track your heart activity for several days, if needed.  Stress tests. These can be done through exercise or by taking medicine that makes your heart beat more quickly.  Blood tests.  Imaging tests. TREATMENT  Your treatment depends on what is causing your chest pain. Treatment may include:  Medicines. These may include:  Acid blockers for heartburn.  Anti-inflammatory medicine.  Pain  medicine for inflammatory conditions.  Antibiotic medicine, if an infection is present.  Medicines to dissolve blood clots.  Medicines to treat coronary artery disease.  Supportive care for conditions that do not require medicines. This may include:  Resting.  Applying heat or cold packs to injured areas.  Limiting activities until pain  decreases. HOME CARE INSTRUCTIONS  If you were prescribed an antibiotic medicine, finish it all even if you start to feel better.  Avoid any activities that bring on chest pain.  Do not use any tobacco products, including cigarettes, chewing tobacco, or electronic cigarettes. If you need help quitting, ask your health care provider.  Do not drink alcohol.  Take medicines only as directed by your health care provider.  Keep all follow-up visits as directed by your health care provider. This is important. This includes any further testing if your chest pain does not go away.  If heartburn is the cause for your chest pain, you may be told to keep your head raised (elevated) while sleeping. This reduces the chance that acid will go from your stomach into your esophagus.  Make lifestyle changes as directed by your health care provider. These may include:  Getting regular exercise. Ask your health care provider to suggest some activities that are safe for you.  Eating a heart-healthy diet. A registered dietitian can help you to learn healthy eating options.  Maintaining a healthy weight.  Managing diabetes, if necessary.  Reducing stress. SEEK MEDICAL CARE IF:  Your chest pain does not go away after treatment.  You have a rash with blisters on your chest.  You have a fever. SEEK IMMEDIATE MEDICAL CARE IF:   Your chest pain is worse.  You have an increasing cough, or you cough up blood.  You have severe abdominal pain.  You have severe weakness.  You faint.  You have chills.  You have sudden, unexplained chest discomfort.  You have sudden, unexplained discomfort in your arms, back, neck, or jaw.  You have shortness of breath at any time.  You suddenly start to sweat, or your skin gets clammy.  You feel nauseous or you vomit.  You suddenly feel light-headed or dizzy.  Your heart begins to beat quickly, or it feels like it is skipping beats. These symptoms may  represent a serious problem that is an emergency. Do not wait to see if the symptoms will go away. Get medical help right away. Call your local emergency services (911 in the [place] Do not drive yourself to the hospital.   This information is not intended to replace advice given to you by your health care provider. Make sure you discuss any questions you have with your health care provider.   Document Released: 05/26/2005 Document Revised: 09/06/2014 Document Reviewed: 03/22/2014 Elsevier Interactive Patient Education [employer].

## 2015-08-05 NOTE — ED Notes (Signed)
Chest pain. Sharp pain and at times feels like someone is hitting her in the chest. Symptoms x 2 days. She has been told in the past her chest pain is due to a heart murmur.

## 2015-09-05 MED FILL — MONTELUKAST SOD 10 MG TAB: 10 | 30 days supply | Qty: 30 | Fill #1

## 2015-10-08 MED FILL — ALL DAY ALLERGY 10 MG TAB: 10 | 100 days supply | Qty: 100 | Fill #0

## 2015-11-23 IMAGING — US US PELVIS COMPLETE
1 series · 13 of 25 positions shown · non-contrast
Comparison: None

CLINICAL DATA: Right lower quadrant abdominal pain.

EXAM:
TRANSABDOMINAL AND TRANSVAGINAL ULTRASOUND OF PELVIS
TECHNIQUE: Both transabdominal and transvaginal ultrasound examinations of the
pelvis were performed. Transabdominal technique was performed for
global imaging of the pelvis including uterus, ovaries, adnexal
regions, and pelvic cul-de-sac. It was necessary to proceed with
endovaginal exam following the transabdominal exam to visualize the
uterus and ovaries in greater detail.

[Series 1: us pelvis complete · 0.35mm/px · 13 of 126 slices shown]
[im 1/126]
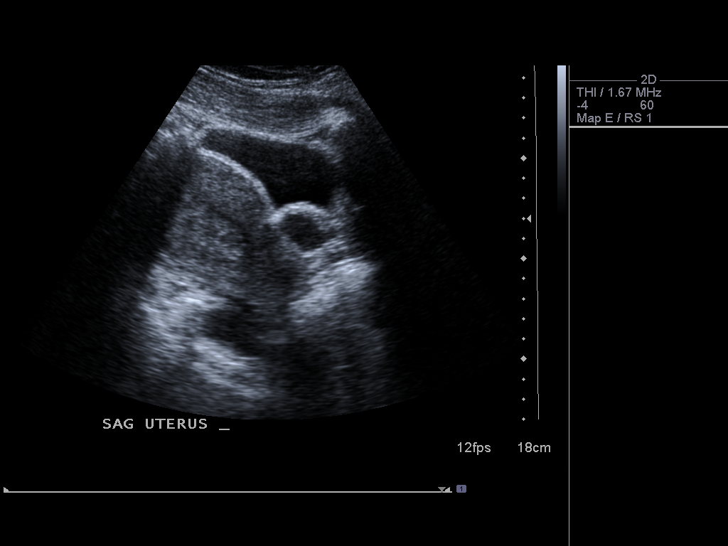
[im 11/126]
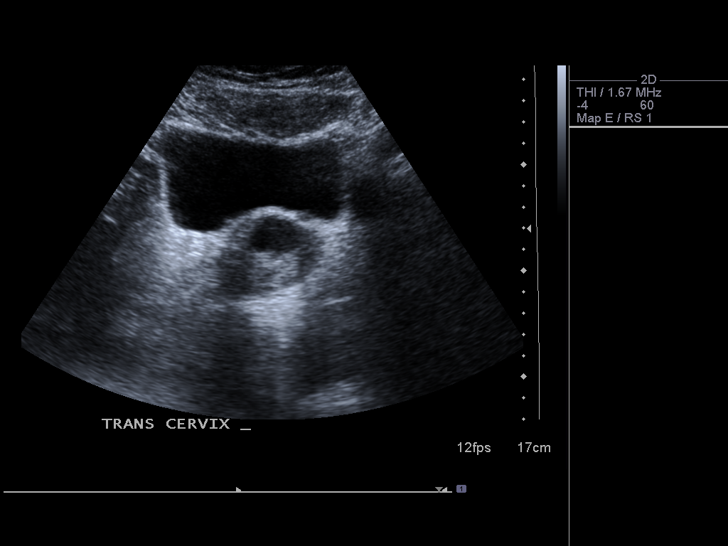
[im 21/126]
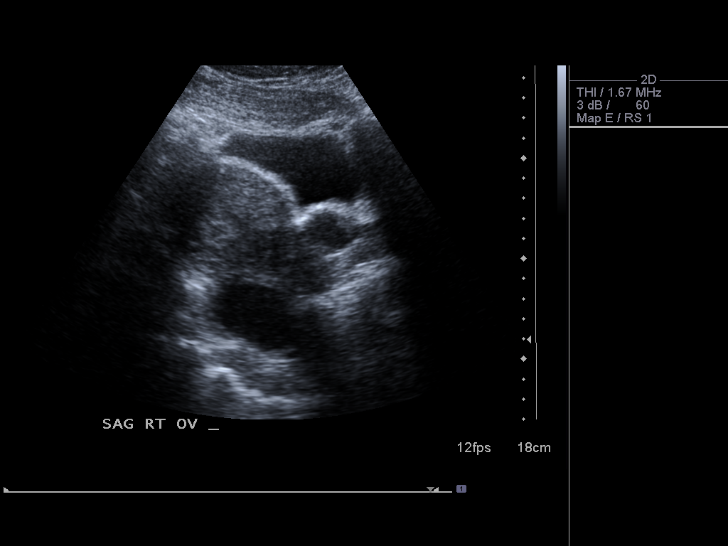
[im 32/126]
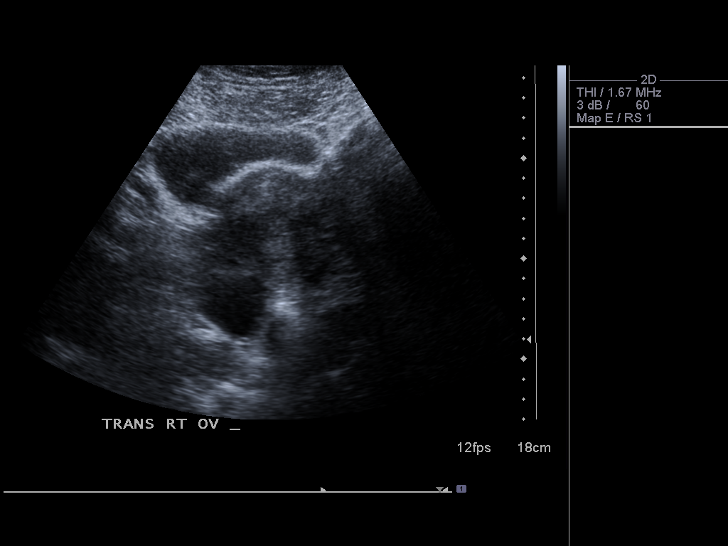
[im 42/126]
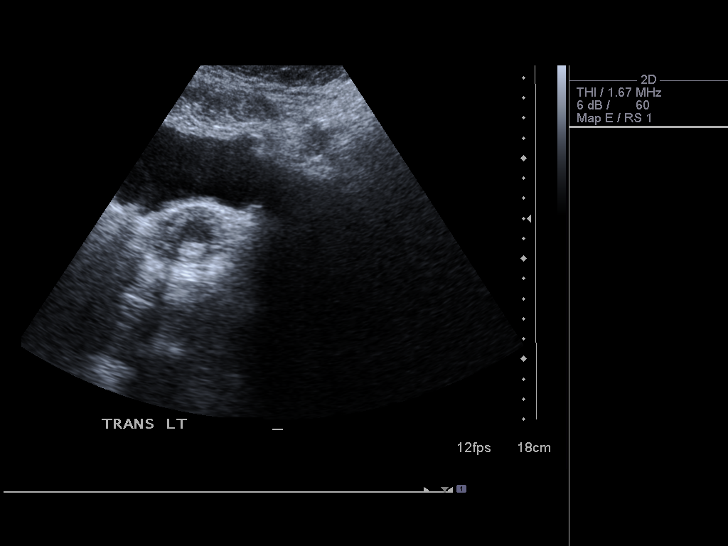
[im 53/126]
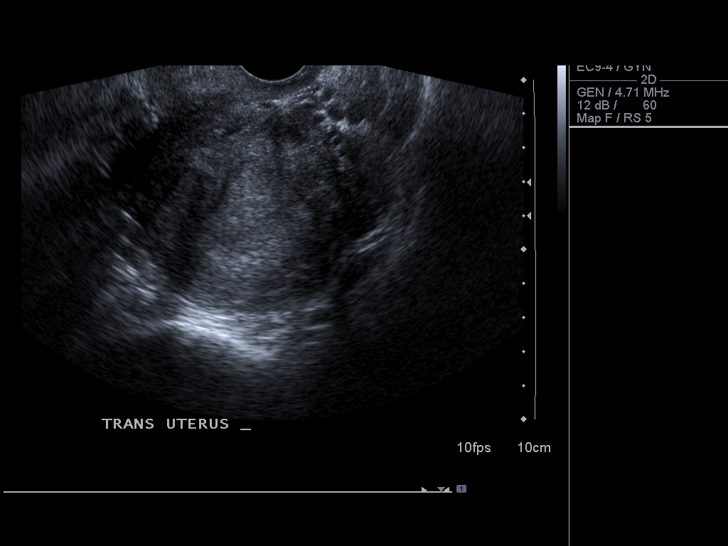
[im 63/126]
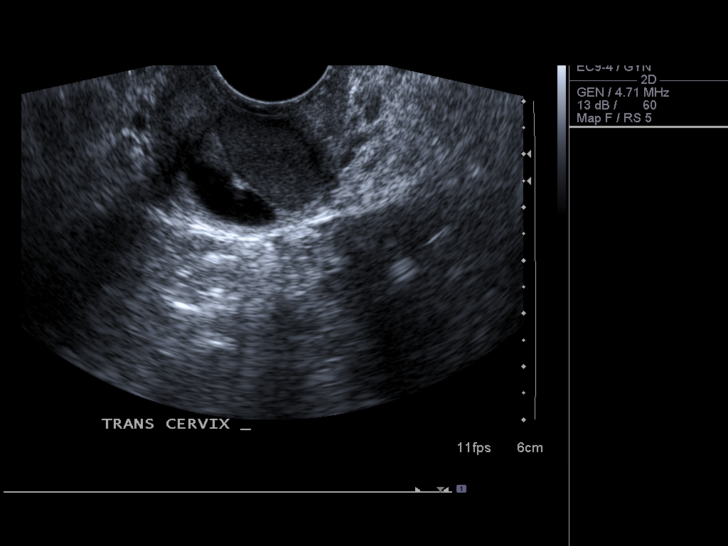
[im 73/126]
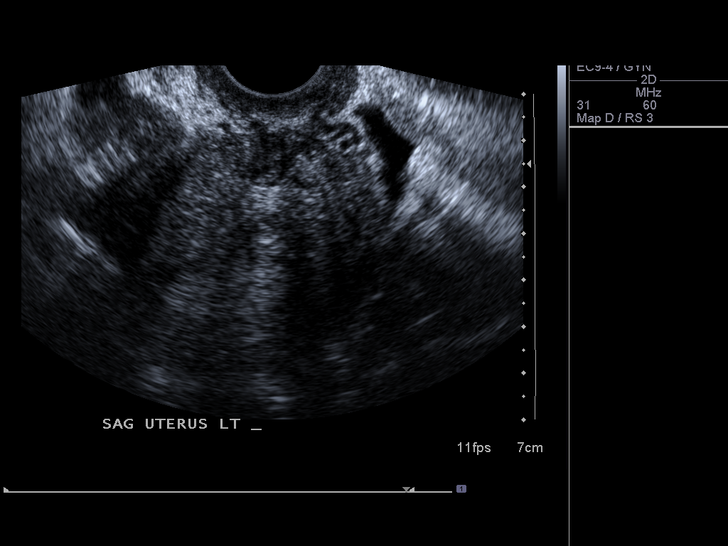
[im 84/126]
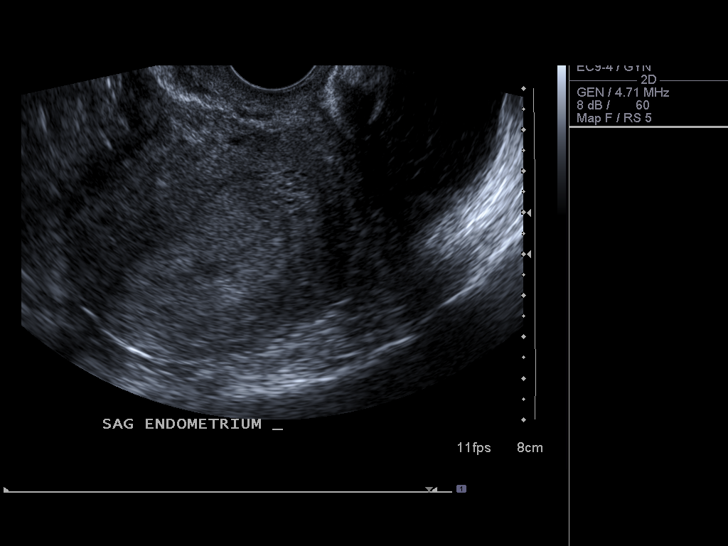
[im 94/126]
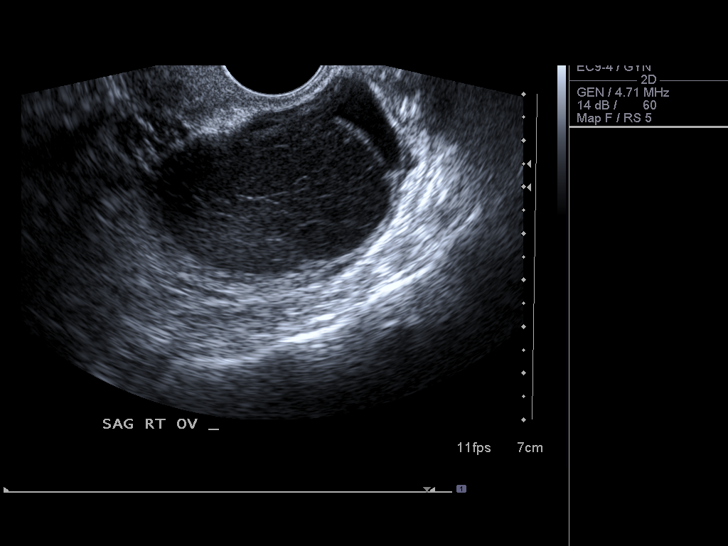
[im 105/126]
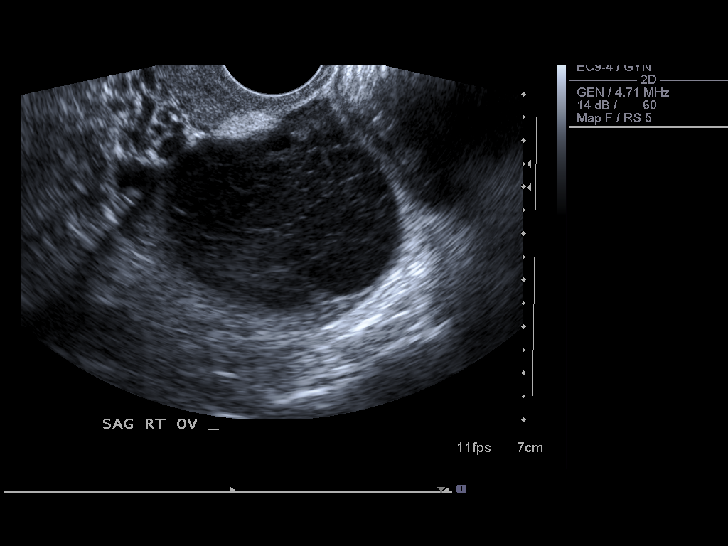
[im 115/126]
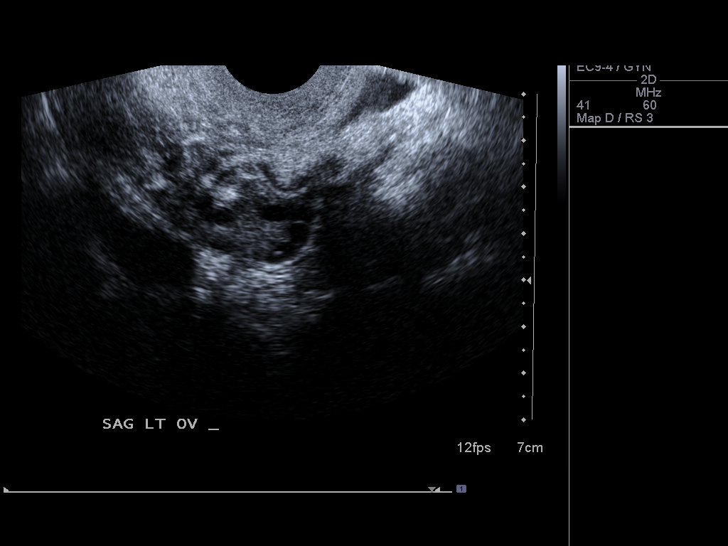
[im 126/126]
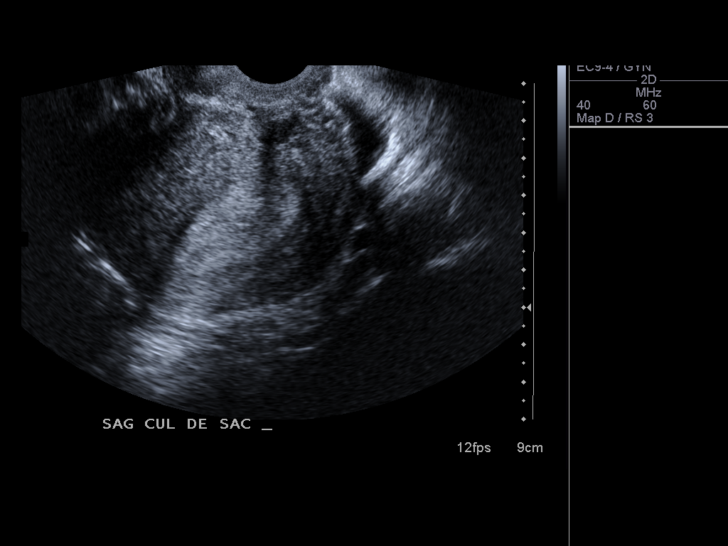

[13 of 25 positions shown; findings below may reference images not displayed]

FINDINGS: Uterus

Measurements: 5.6 x 6.3 x 7.4 cm. No fibroids or other mass
visualized.

Endometrium

Thickness: 2.6 cm.  Diffusely thickened, of uncertain significance.

Right ovary

Measurements: 6.1 x 3.9 x 4.4 cm. There is a 5.3 x 4.5 x 3.9 cm
collection at the right ovary, with lace-like structure and internal
echoes, compatible with a hemorrhagic cyst.

Left ovary

Measurements: 3.5 x 1.7 x 1.5 cm. Normal appearance/no adnexal mass.

Other findings

There is a complex collection of fluid at the cervix, with a simple
cyst component, an apparent hemorrhagic component, and question of a
small soft tissue nodule. This measures approximately 3.0 x 2.5 x
1.8 cm.

A small amount of free fluid is seen within the pelvic cul-de-sac.
IMPRESSION: 1. 5.3 cm hemorrhagic cyst noted at the right ovary.
2. Complex collection of fluid at the cervix, with a hemorrhagic
component, a simple cyst component, and question of a small soft
tissue nodule. This measures approximately 3.0 x 2.5 x 1.8 cm.
Further evaluation of the cervix would be helpful, as deemed
clinically appropriate.
3. Diffuse endometrial echo complex thickening, of uncertain
significance. This may reflect some degree of obstruction from the
collection at the cervix.
4. No evidence for ovarian torsion.

## 2015-12-23 MED FILL — OMEPRAZOLE DR 40 MG CAPSULE: 40 | 30 days supply | Qty: 30 | Fill #1

## 2016-01-27 ENCOUNTER — Emergency Department (HOSPITAL_BASED_OUTPATIENT_CLINIC_OR_DEPARTMENT_OTHER)
Admission: EM | Admit: 2016-01-27 | Discharge: 2016-01-27 | Disposition: A | Discharge status: Home / Self Care | Payer: Self-pay | Attending: Emergency Medicine | Admitting: Emergency Medicine

## 2016-01-27 DIAGNOSIS — D500 Iron deficiency anemia secondary to blood loss (chronic): Secondary | ICD-10-CM | POA: Insufficient documentation

## 2016-01-27 DIAGNOSIS — J45909 Unspecified asthma, uncomplicated: Secondary | ICD-10-CM | POA: Insufficient documentation

## 2016-01-27 DIAGNOSIS — Z7982 Long term (current) use of aspirin: Secondary | ICD-10-CM | POA: Insufficient documentation

## 2016-01-27 DIAGNOSIS — K2970 Gastritis, unspecified, without bleeding: Secondary | ICD-10-CM | POA: Insufficient documentation

## 2016-01-27 LAB — CBC WITH DIFFERENTIAL/PLATELET
Basophils Absolute: 0 10*3/uL (ref 0.0–0.1)
Basophils Relative: 0 %
Eosinophils Absolute: 0.1 10*3/uL (ref 0.0–0.7)
Eosinophils Relative: 1 %
HCT: 29 % — ABNORMAL LOW (ref 36.0–46.0)
Hemoglobin: 9 g/dL — ABNORMAL LOW (ref 12.0–15.0)
Lymphocytes Relative: 34 %
Lymphs Abs: 2.3 10*3/uL (ref 0.7–4.0)
MCH: 24.2 pg — ABNORMAL LOW (ref 26.0–34.0)
MCHC: 31 g/dL (ref 30.0–36.0)
MCV: 78 fL (ref 78.0–100.0)
Monocytes Absolute: 0.6 10*3/uL (ref 0.1–1.0)
Monocytes Relative: 9 %
Neutro Abs: 3.7 10*3/uL (ref 1.7–7.7)
Neutrophils Relative %: 56 %
Platelets: 311 10*3/uL (ref 150–400)
RBC: 3.72 MIL/uL — ABNORMAL LOW (ref 3.87–5.11)
RDW: 16.9 % — ABNORMAL HIGH (ref 11.5–15.5)
WBC: 6.7 10*3/uL (ref 4.0–10.5)

## 2016-01-27 LAB — COMPREHENSIVE METABOLIC PANEL
ALT: 11 U/L — ABNORMAL LOW (ref 14–54)
AST: 14 U/L — ABNORMAL LOW (ref 15–41)
Albumin: 3.8 g/dL (ref 3.5–5.0)
Alkaline Phosphatase: 61 U/L (ref 38–126)
Anion gap: 8 (ref 5–15)
BUN: 9 mg/dL (ref 6–20)
CO2: 25 mmol/L (ref 22–32)
Calcium: 8.9 mg/dL (ref 8.9–10.3)
Chloride: 102 mmol/L (ref 101–111)
Creatinine, Ser: 0.72 mg/dL (ref 0.44–1.00)
GFR calc Af Amer: >60 mL/min (ref >60)
GFR calc non Af Amer: >60 mL/min (ref >60)
Glucose, Bld: 109 mg/dL — ABNORMAL HIGH (ref 65–99)
Potassium: 3.8 mmol/L (ref 3.5–5.1)
Sodium: 135 mmol/L (ref 135–145)
Total Bilirubin: 0.3 mg/dL (ref 0.3–1.2)
Total Protein: 7.5 g/dL (ref 6.5–8.1)

## 2016-01-27 LAB — LIPASE, BLOOD: Lipase: 29 U/L (ref 11–51)

## 2016-01-27 MED ORDER — SUCRALFATE 1 G PO TABS: ORAL_TABLET | ORAL | Status: DC

## 2016-01-27 MED ORDER — PANTOPRAZOLE SODIUM 40 MG IV SOLR
40.0000 mg | Freq: Once | INTRAVENOUS | Status: AC
  Administered 2016-01-27: 40 mg via INTRAVENOUS
  Filled 2016-01-27: qty 40

## 2016-01-27 MED ORDER — SUCRALFATE 1 G PO TABS
1.0000 g | ORAL_TABLET | Freq: Once | ORAL | Status: AC
  Administered 2016-01-27: 1 g via ORAL
  Filled 2016-01-27: qty 1

## 2016-01-27 MED FILL — OMEPRAZOLE DR 40 MG CAPSULE: 40 | 30 days supply | Qty: 30 | Fill #2

## 2016-01-27 MED FILL — SUCRALFATE 1 GM TABLET: 1 | 10 days supply | Qty: 40 | Fill #0

## 2016-01-27 NOTE — ED Notes (Signed)
Pt in bed watching TV NAD. Pt denies nausea and pain remains at an 8/10.

## 2016-01-27 NOTE — ED Notes (Signed)
Pt reports episode of abd pain  with diarrhea 2 weeks ago after eating at [employer] that resolved with bland diet. Pain has reoccurred over that last 2 days mid epigastric area rated 8/10 denies recent diarrhea or N/V.

## 2016-01-27 NOTE — ED Notes (Signed)
EDP into room. Pt alert, NAD, calm, interactive, "feel better", pain lessened, rates 7/10, (denies: nausea, sob or dizziness), tolerating POs at this time.

## 2016-01-27 NOTE — ED Provider Notes (Signed)
CSN: [id number]     Arrival date & time 01/27/16  0206 History   First MD Initiated Contact with Patient 01/27/16 0225     Chief Complaint  Patient presents with  . Abdominal Pain     (Consider location/radiation/quality/duration/timing/severity/associated sxs/prior Treatment) HPI  This is a 40 year old female who had an episode of epigastric discomfort accompanied diarrhea after eating [employer] 2 weeks ago. The symptoms lasted about a week but improved with a bland diet. She is here with 5 days of epigastric pain that is worsened over the past 2 days. She describes the pain as a burning in the upper epigastrium. It is somewhat worse with movement. She is not aware of any significant change with food. She is not having any nausea, vomiting or diarrhea. She rates her pain as an 8 out of 10. She has been taking omeprazole for the past 2 months but has not taken any new medication for this.  Past Medical History  Diagnosis Date  . Gastric ulcer   . Migraines   . Fibroid   . Tonsillitis   . Asthma   . Heart murmur    No past surgical history on file. Family History  Problem Relation Age of Onset  . Hypertension Mother   . Stroke Maternal Grandmother   . Stroke Maternal Grandfather    Social History  Substance Use Topics  . Smoking status: Never Smoker   . Smokeless tobacco: Never Used  . Alcohol Use: Yes     Comment: occassionally   OB History    Gravida Para Term Preterm AB TAB SAB Ectopic Multiple Living   0 0 0 0 0 0 0 0 0 0      Review of Systems  All other systems reviewed and are negative.   Allergies  Food  Home Medications   Prior to Admission medications   Medication Sig Start Date End Date Taking? Authorizing Provider  albuterol (PROVENTIL HFA;VENTOLIN HFA) 108 (90 BASE) MCG/ACT inhaler Inhale into the lungs every 6 (six) hours as needed for wheezing or shortness of breath.    Historical Provider, MD  aspirin EC 81 MG tablet Take 81 mg by mouth daily as  needed for mild pain or moderate pain.    Historical Provider, MD  Cholecalciferol (VITAMIN D PO) Take 1 tablet by mouth daily.    Historical Provider, MD  [facility] Liver Oil (COD LIVER PO) Take 1 tablet by mouth daily.    Historical Provider, MD  GINKGO BILOBA PO Take 1 tablet by mouth daily.    Historical Provider, MD  tobramycin (TOBREX) 0.3 % ophthalmic solution Place 1 drop into the right eye every 4 (four) hours. 07/03/15   Orlie Dakin, MD   BP 125/72 mmHg  Pulse 60  Temp(Src) 97.7 F (36.5 C) (Oral)  Resp 16  SpO2 99%  LMP 12/29/2015   Physical Exam General: Well-developed, well-nourished female in no acute distress; appearance consistent with age of record HENT: normocephalic; atraumatic Eyes: pupils equal, round and reactive to light; extraocular muscles intact Neck: supple Heart: regular rate and rhythm Lungs: clear to auscultation bilaterally Abdomen: soft; nondistended; epigastric tenderness; no masses or hepatosplenomegaly; bowel sounds present Extremities: No deformity; full range of motion; pulses normal Neurologic: Awake, alert and oriented; motor function intact in all extremities and symmetric; no facial droop Skin: Warm and dry Psychiatric: Normal mood and affect    ED Course  Procedures (including critical care time)   MDM  Nursing notes and vitals signs, including  pulse oximetry, reviewed.  Summary of this visit's results, reviewed by myself:  Labs:  Results for orders placed or performed during the hospital encounter of 01/27/16 (from the past 24 hour(s))  CBC with Differential/Platelet     Status: Abnormal   Collection Time: 01/27/16  3:00 AM  Result Value Ref Range   WBC 6.7 4.0 - 10.5 K/uL   RBC 3.72 (L) 3.87 - 5.11 MIL/uL   Hemoglobin 9.0 (L) 12.0 - 15.0 g/dL   HCT 29.0 (L) 36.0 - 46.0 %   MCV 78.0 78.0 - 100.0 fL   MCH 24.2 (L) 26.0 - 34.0 pg   MCHC 31.0 30.0 - 36.0 g/dL   RDW 16.9 (H) 11.5 - 15.5 %   Platelets 311 150 - 400 K/uL    Neutrophils Relative % 56 %   Neutro Abs 3.7 1.7 - 7.7 K/uL   Lymphocytes Relative 34 %   Lymphs Abs 2.3 0.7 - 4.0 K/uL   Monocytes Relative 9 %   Monocytes Absolute 0.6 0.1 - 1.0 K/uL   Eosinophils Relative 1 %   Eosinophils Absolute 0.1 0.0 - 0.7 K/uL   Basophils Relative 0 %   Basophils Absolute 0.0 0.0 - 0.1 K/uL  Comprehensive metabolic panel     Status: Abnormal   Collection Time: 01/27/16  3:00 AM  Result Value Ref Range   Sodium 135 135 - 145 mmol/L   Potassium 3.8 3.5 - 5.1 mmol/L   Chloride 102 101 - 111 mmol/L   CO2 25 22 - 32 mmol/L   Glucose, Bld 109 (H) 65 - 99 mg/dL   BUN 9 6 - 20 mg/dL   Creatinine, Ser 0.72 0.44 - 1.00 mg/dL   Calcium 8.9 8.9 - 10.3 mg/dL   Total Protein 7.5 6.5 - 8.1 g/dL   Albumin 3.8 3.5 - 5.0 g/dL   AST 14 (L) 15 - 41 U/L   ALT 11 (L) 14 - 54 U/L   Alkaline Phosphatase 61 38 - 126 U/L   Total Bilirubin 0.3 0.3 - 1.2 mg/dL   GFR calc non Af Amer >60 >60 mL/min   GFR calc Af Amer >60 >60 mL/min   Anion gap 8 5 - 15  Lipase, blood     Status: None   Collection Time: 01/27/16  3:00 AM  Result Value Ref Range   Lipase 29 11 - 51 U/L   3:35 AM Feels better after Carafate by mouth and Protonix IV. Will start on Carafate and have her follow-up with her PCP. She was advised of her anemia; she states she has heavy periods and she was advised to take an over-the-counter iron supplement.      Shanon Rosser, MD 01/27/16 [phone]

## 2016-02-02 ENCOUNTER — Other Ambulatory Visit: Payer: Self-pay

## 2016-02-02 ENCOUNTER — Emergency Department (HOSPITAL_BASED_OUTPATIENT_CLINIC_OR_DEPARTMENT_OTHER)
Admission: EM | Admit: 2016-02-02 | Discharge: 2016-02-02 | Disposition: A | Discharge status: Home / Self Care | Payer: Self-pay | Attending: Emergency Medicine | Admitting: Emergency Medicine

## 2016-02-02 ENCOUNTER — Encounter (HOSPITAL_BASED_OUTPATIENT_CLINIC_OR_DEPARTMENT_OTHER): Payer: Self-pay | Admitting: *Deleted

## 2016-02-02 DIAGNOSIS — I471 Supraventricular tachycardia: Secondary | ICD-10-CM | POA: Insufficient documentation

## 2016-02-02 DIAGNOSIS — Z7982 Long term (current) use of aspirin: Secondary | ICD-10-CM | POA: Insufficient documentation

## 2016-02-02 DIAGNOSIS — J45909 Unspecified asthma, uncomplicated: Secondary | ICD-10-CM | POA: Insufficient documentation

## 2016-02-02 DIAGNOSIS — R0602 Shortness of breath: Secondary | ICD-10-CM

## 2016-02-02 DIAGNOSIS — R42 Dizziness and giddiness: Secondary | ICD-10-CM | POA: Insufficient documentation

## 2016-02-02 LAB — BASIC METABOLIC PANEL
Anion gap: 14 (ref 5–15)
BUN: 12 mg/dL (ref 6–20)
CO2: 20 mmol/L — ABNORMAL LOW (ref 22–32)
Calcium: 9.6 mg/dL (ref 8.9–10.3)
Chloride: 101 mmol/L (ref 101–111)
Creatinine, Ser: 1.02 mg/dL — ABNORMAL HIGH (ref 0.44–1.00)
GFR calc Af Amer: >60 mL/min (ref >60)
GFR calc non Af Amer: >60 mL/min (ref >60)
Glucose, Bld: 135 mg/dL — ABNORMAL HIGH (ref 65–99)
Potassium: 3.3 mmol/L — ABNORMAL LOW (ref 3.5–5.1)
Sodium: 135 mmol/L (ref 135–145)

## 2016-02-02 LAB — URINALYSIS, ROUTINE W REFLEX MICROSCOPIC
Bilirubin Urine: NEGATIVE
Glucose, UA: NEGATIVE mg/dL
Hgb urine dipstick: NEGATIVE
Ketones, ur: 15 mg/dL — AB
Leukocytes, UA: NEGATIVE
Nitrite: NEGATIVE
Protein, ur: NEGATIVE mg/dL
Specific Gravity, Urine: 1.003 — ABNORMAL LOW (ref 1.005–1.030)
pH: 6 (ref 5.0–8.0)

## 2016-02-02 LAB — RAPID URINE DRUG SCREEN, HOSP PERFORMED
Amphetamines: NOT DETECTED
Barbiturates: NOT DETECTED
Benzodiazepines: NOT DETECTED
Cocaine: NOT DETECTED
Opiates: NOT DETECTED
Tetrahydrocannabinol: NOT DETECTED

## 2016-02-02 LAB — TSH: TSH: 1.791 u[IU]/mL (ref 0.350–4.500)

## 2016-02-02 MED ORDER — POTASSIUM CHLORIDE CRYS ER 20 MEQ PO TBCR
40.0000 meq | EXTENDED_RELEASE_TABLET | Freq: Once | ORAL | Status: AC
  Administered 2016-02-02: 40 meq via ORAL
  Filled 2016-02-02: qty 2

## 2016-02-02 MED ORDER — ADENOSINE 6 MG/2ML IV SOLN
INTRAVENOUS | Status: AC
  Administered 2016-02-02: 6 mg via INTRAVENOUS
  Filled 2016-02-02: qty 2

## 2016-02-02 MED ORDER — ADENOSINE 6 MG/2ML IV SOLN
6.0000 mg | Freq: Once | INTRAVENOUS | Status: AC
  Administered 2016-02-02: 6 mg via INTRAVENOUS

## 2016-02-02 MED ORDER — POTASSIUM CHLORIDE CRYS ER 20 MEQ PO TBCR: 40.0000 meq | EXTENDED_RELEASE_TABLET | Freq: Two times a day (BID) | ORAL | Status: DC

## 2016-02-02 NOTE — ED Notes (Signed)
Patient is calmer and less SOB. The patient reports that she is feeling better. Paler is better and patient back to normal skin color, no diaphoresis is noted at this time.  Patient is resting comfortably with call bell next to her. Rn at bedside from time arrival to room 14 to present.

## 2016-02-02 NOTE — ED Notes (Signed)
Sob. Chest pain. Diaphoretic and pale.Heart rate 220. She came straight here after leaving the [facility] for a work out. Feels like she is going to pass out.

## 2016-02-02 NOTE — ED Notes (Signed)
Dr. Dayna Barker at bedside. Pt placed on zoll prior to administering adenosine per MD order

## 2016-02-02 NOTE — ED Provider Notes (Signed)
CSN: [id number]     Arrival date & time 02/02/16  1715 History  By signing my name below, I, Altamease Oiler, attest that this documentation has been prepared under the direction and in the presence of Merrily Pew, MD. Electronically Signed: Altamease Oiler, ED Scribe. 02/02/2016. 6:50 PM   Chief Complaint  Patient presents with  . Shortness of Breath   The history is provided by the patient. No language interpreter was used.   Abigail Jacobson is a 40 y.o. female with PMHx of asthma  who presents to the Emergency Department complaining of light headedness with sudden onset this evening while on the treadmill. Pt states that she recently started working out to lose weight and today started to feel as of she would pass out. Associated symptoms include SOB. She has been eating and drinking as usual. She denies any recent change in caffeine intact but notes having a cup of coffee today. Pt has no history of thyroid disease.   After family arrived they revealed that the patient actually did drink quite a bit in caffeine each day. No dietary supplements.  Past Medical History  Diagnosis Date  . Gastric ulcer   . Migraines   . Fibroid   . Tonsillitis   . Asthma   . Heart murmur    History reviewed. No pertinent past surgical history. Family History  Problem Relation Age of Onset  . Hypertension Mother   . Stroke Maternal Grandmother   . Stroke Maternal Grandfather    Social History  Substance Use Topics  . Smoking status: Never Smoker   . Smokeless tobacco: Never Used  . Alcohol Use: Yes     Comment: occassionally   OB History    Gravida Para Term Preterm AB TAB SAB Ectopic Multiple Living   0 0 0 0 0 0 0 0 0 0      Review of Systems  Respiratory: Positive for shortness of breath.   Neurological: Positive for light-headedness.  All other systems reviewed and are negative.  Allergies  Food  Home Medications   Prior to Admission medications   Medication Sig Start Date End Date  Taking? Authorizing Provider  albuterol (PROVENTIL HFA;VENTOLIN HFA) 108 (90 BASE) MCG/ACT inhaler Inhale into the lungs every 6 (six) hours as needed for wheezing or shortness of breath.    Historical Provider, MD  aspirin EC 81 MG tablet Take 81 mg by mouth daily as needed for mild pain or moderate pain.    Historical Provider, MD  Cholecalciferol (VITAMIN D PO) Take 1 tablet by mouth daily.    Historical Provider, MD  [facility] Liver Oil (COD LIVER PO) Take 1 tablet by mouth daily.    Historical Provider, MD  GINKGO BILOBA PO Take 1 tablet by mouth daily.    Historical Provider, MD  potassium chloride SA (K-DUR,KLOR-CON) 20 MEQ tablet Take 2 tablets (40 mEq total) by mouth 2 (two) times daily. 02/02/16   Merrily Pew, MD  sucralfate (CARAFATE) 1 g tablet Take one tablet 3 times a day with meals and again at bedtime. Take omeprazole at least 30 minutes before first dose of Carafate. 01/27/16   John Molpus, MD  tobramycin (TOBREX) 0.3 % ophthalmic solution Place 1 drop into the right eye every 4 (four) hours. 07/03/15   Orlie Dakin, MD   Pulse 218  Resp 32  Wt 243 lb (110.224 kg)  SpO2 100%  LMP 12/29/2015 Physical Exam  Constitutional: She appears well-developed and well-nourished.  HENT:  Head: Normocephalic  and atraumatic.  Neck: Normal range of motion.  Cardiovascular: Normal rate and regular rhythm.   Heart rate 160-180 BPM  Pulmonary/Chest: No stridor. Tachypnea noted. No respiratory distress.  CTAB   Abdominal: She exhibits no distension.  Musculoskeletal: Normal range of motion. She exhibits no edema or tenderness.  No LE edema   Neurological: She is alert.  Skin: Skin is warm and dry.  Psychiatric:  Mental status is appropriate   Nursing note and vitals reviewed.   ED Course  Procedures (including critical care time)  CRITICAL CARE Performed by: Merrily Pew Total critical care time: 35 minutes Critical care time was exclusive of separately billable procedures and treating  other patients. Critical care was necessary to treat or prevent imminent or life-threatening deterioration. Critical care was time spent personally by me on the following activities: development of treatment plan with patient and/or surrogate as well as nursing, discussions with consultants, evaluation of patient's response to treatment, examination of patient, obtaining history from patient or surrogate, ordering and performing treatments and interventions, ordering and review of laboratory studies, ordering and review of radiographic studies, pulse oximetry and re-evaluation of patient's condition.   DIAGNOSTIC STUDIES: Oxygen Saturation is 100% on RA,  normal by my interpretation.    COORDINATION OF CARE: 5:23 PM Discussed treatment plan which includes adenosine, lab work, EKG with pt at bedside and pt agreed to plan.  6:49 PM I re-evaluated the patient and updated her family who is at bedside.    Labs Review Labs Reviewed  BASIC METABOLIC PANEL - Abnormal; Notable for the following:    Potassium 3.3 (*)    CO2 20 (*)    Glucose, Bld 135 (*)    Creatinine, Ser 1.02 (*)    All other components within normal limits  URINALYSIS, ROUTINE W REFLEX MICROSCOPIC (NOT AT [facility]) - Abnormal; Notable for the following:    Specific Gravity, Urine 1.003 (*)    Ketones, ur 15 (*)    All other components within normal limits  TSH  URINE RAPID DRUG SCREEN, HOSP PERFORMED    Imaging Review No results found. I have personally reviewed and evaluated these lab results as part of my medical decision-making.   EKG Interpretation   Date/Time:  Monday February 02 2016 17:38:29 EDT Ventricular Rate:  97 PR Interval:  141 QRS Duration: 107 QT Interval:  346 QTC Calculation: 439 R Axis:   56 Text Interpretation:  Sinus rhythm RSR' in V1 or V2, right VCD or RVH  Confirmed by Christy Gentles  MD, DONALD ([id number]) on 02/03/2016 2:39:33 PM      MDM   Final diagnoses:  SOB (shortness of breath)  SVT  (supraventricular tachycardia) ([place])    40 year old female here with dyspnea of acute onset. Had a heart rate of about 200 on initial exam consistent with likely SVT on EKG as well. Attempted vagal maneuvers without success. Used 6 mg of adenosine with subsequent conversion to sinus rhythm. Initial sinus tachycardia which improved a lot of fluids to regular sinus rhythm. Repeat EKG without any evidence of aberrant conduction pathways.  Suspect symptoms are likely related to increased caffeine and exercise. She will decrease her caffeine wash his started to exercise more. No need for follow-up at this time however this happens again I did discuss vagal maneuvers and that she will likely need to see her primary doctor or cardiology.   New Prescriptions: Discharge Medication List as of 02/02/2016  7:54 PM    START taking these medications  Details  potassium chloride SA (K-DUR,KLOR-CON) 20 MEQ tablet Take 2 tablets (40 mEq total) by mouth 2 (two) times daily., Starting 02/02/2016, Until Discontinued, Print        I have personally and contemperaneously reviewed labs and imaging and used in my decision making as above.   A medical screening exam was performed and I feel the patient has had an appropriate workup for their chief complaint at this time and likelihood of emergent condition existing is low and thus workup can continue on an outpatient basis.. Their vital signs are stable. They have been counseled on decision, discharge, follow up and which symptoms necessitate immediate return to the emergency department.  They verbally stated understanding and agreement with plan and discharged in stable condition.   I personally performed the services described in this documentation, which was scribed in my presence. The recorded information has been reviewed and is accurate.  Merrily Pew, MD 02/03/16 [phone]

## 2016-02-02 NOTE — ED Notes (Signed)
Pt. Understood all discharge instructions with no questions.

## 2016-02-03 MED FILL — POTASSIUM CL ER 20 MEQ TABL: 20 | 7 days supply | Qty: 28 | Fill #0

## 2016-02-06 ENCOUNTER — Telehealth: Payer: Self-pay | Admitting: Cardiology

## 2016-02-13 NOTE — Telephone Encounter (Signed)
Close encounter 

## 2016-02-17 NOTE — Progress Notes (Signed)
      HPI: 40 yo female for evaluation of chest pain. Stress echo 4/16 normal. Patient states she has had intermittent chest pain for approximately one year. The pain can be on the right or left side. No radiation. No associated symptoms. Can occur after drinking coffee or eating certain foods. It is not exertional. Resolves spontaneously after 1-2 hours. She was seen in the emergency room recently with sudden onset palpitations. Electrocardiogram showed supraventricular tachycardia with RV conduction delay and diffuse ST depression. She converted to sinus rhythm and has had no symptoms since. She otherwise denies dyspnea on exertion, orthopnea, PND, pedal edema, syncope or exertional chest pain.  Current Outpatient Prescriptions  Medication Sig Dispense Refill  . albuterol (PROVENTIL HFA;VENTOLIN HFA) 108 (90 BASE) MCG/ACT inhaler Inhale into the lungs every 6 (six) hours as needed for wheezing or shortness of breath.    Marland Kitchen aspirin EC 81 MG tablet Take 81 mg by mouth daily as needed for mild pain or moderate pain.    . Cholecalciferol (VITAMIN D PO) Take 1 tablet by mouth daily.    . Cod Liver Oil (COD LIVER PO) Take 1 tablet by mouth daily.     No current facility-administered medications for this visit.     Past Medical History  Diagnosis Date  . Gastric ulcer   . Migraines   . Fibroid   . Tonsillitis   . Asthma   . Heart murmur     Past Surgical History  Procedure Laterality Date  . No previous surgery      Social History   Social History  . Marital Status: Single    Spouse Name: N/A  . Number of Children: N/A  . Years of Education: N/A   Occupational History  . propert manager    Social History Main Topics  . Smoking status: Never Smoker   . Smokeless tobacco: Never Used  . Alcohol Use: Yes     Comment: occassionally  . Drug Use: No  . Sexual Activity: Yes    Birth Control/ Protection: None   Other Topics Concern  . Not on file   Social History Narrative     Family History  Problem Relation Age of Onset  . Hypertension Mother   . Stroke Maternal Grandmother   . Stroke Maternal Grandfather     ROS: no fevers or chills, productive cough, hemoptysis, dysphasia, odynophagia, melena, hematochezia, dysuria, hematuria, rash, seizure activity, orthopnea, PND, pedal edema, claudication. Remaining systems are negative.  Physical Exam: Well-developed well-nourished in no acute distress.  Skin is warm and dry.  HEENT is normal.  Neck is supple.  Chest is clear to auscultation with normal expansion.  Cardiovascular exam is regular rate and rhythm.  Abdominal exam nontender or distended. No masses palpated. Extremities show no edema. neuro grossly intact  ECG NSR, RVCD, nonspecific ST changes    Assessment and plan 1 supraventricular tachycardia-patient had an episode of SVT with associated palpitations. It was her first episode by report and none since. Schedule echocardiogram to assess LV function. Note TSH was normal. Add Toprol 25 mg daily. If she has more frequent episodes in the future we could consider referral for ablation. 2 chest pain-atypical. Previous stress echocardiogram normal. We'll not pursue further ischemia evaluation. Kirk Ruths

## 2016-02-25 ENCOUNTER — Ambulatory Visit (INDEPENDENT_AMBULATORY_CARE_PROVIDER_SITE_OTHER): Payer: Medicaid - Out of State | Admitting: Cardiology

## 2016-02-25 ENCOUNTER — Encounter: Payer: Self-pay | Admitting: Cardiology

## 2016-02-25 ENCOUNTER — Ambulatory Visit: Payer: Medicaid - Out of State | Admitting: Cardiology

## 2016-02-25 VITALS — BP 124/82 | HR 69 | Ht 67.0 in | Wt 244.4 lb

## 2016-02-25 DIAGNOSIS — R9431 Abnormal electrocardiogram [ECG] [EKG]: Secondary | ICD-10-CM

## 2016-02-25 DIAGNOSIS — R072 Precordial pain: Secondary | ICD-10-CM

## 2016-02-25 DIAGNOSIS — I471 Supraventricular tachycardia: Secondary | ICD-10-CM

## 2016-02-25 MED ORDER — METOPROLOL SUCCINATE ER 25 MG PO TB24: 25.0000 mg | ORAL_TABLET | Freq: Every day | ORAL | Status: AC

## 2016-02-25 MED FILL — METOPROLOL SUCC ER 25 MG TA: 25 | 30 days supply | Qty: 30 | Fill #0

## 2016-02-25 NOTE — Patient Instructions (Addendum)
START METOPROLOL SUCC ER 25 MG ONCE DAILY AT BEDTIME  STOP ASPIRIN  Your physician has requested that you have an echocardiogram. Echocardiography is a painless test that uses sound waves to create images of your heart. It provides your doctor with information about the size and shape of your heart and how well your heart's chambers and valves are working. This procedure takes approximately one hour. There are no restrictions for this procedure.   Your physician wants you to follow-up in: [place] will receive a reminder letter in the mail two months in advance. If you don't receive a letter, please call our office to schedule the follow-up appointment.

## 2016-02-27 ENCOUNTER — Telehealth: Payer: Self-pay | Admitting: Cardiology

## 2016-02-27 NOTE — Telephone Encounter (Signed)
Follow-up ° ° ° ° °The pt is returning the nurses call °

## 2016-02-27 NOTE — Telephone Encounter (Signed)
New Message   Pt c/o medication issue:  1. Name of Medication: Metoprolol   2. How are you currently taking this medication (dosage and times per day)? 25 mg  3. Are you having a reaction (difficulty breathing--STAT)? no  4. What is your medication issue? Pt requested to speak with RN. Pt wants to know if its okay to exercise while on metoprolol. Please call back to discuss

## 2016-02-27 NOTE — Telephone Encounter (Signed)
No limitations on activities or exercise; toprol may decrease HR with activity; call if any recurrent episodes of afib Kirk Ruths

## 2016-02-27 NOTE — Telephone Encounter (Signed)
Returned call.  Pt had recent OV to f/u on SVT eval'd in ED. Was started on metoprolol succ 25mg  daily.  Pt had questions - wanted to know if Dr. Stanford Breed has any specific recommendations for exercise, exertional level. Notes she had her initial episode of SVT on treadmill, but would like to continue to exercise w goal of weight loss. She usually starts any exercise session w treadmill before moving to machine weights, etc.  Wanted some guidance on how being on metoprolol would affect exercise/target HT. Also she is concerned about the potential for her to go back into SVT.  Requesting guidance from Dr. Stanford Breed.

## 2016-02-27 NOTE — Telephone Encounter (Signed)
Left msg to call.

## 2016-03-01 NOTE — Telephone Encounter (Signed)
Spoke with pt, Aware of dr crenshaw's recommendations.  °

## 2016-03-10 ENCOUNTER — Ambulatory Visit (HOSPITAL_BASED_OUTPATIENT_CLINIC_OR_DEPARTMENT_OTHER)
Admission: RE | Admit: 2016-03-10 | Discharge: 2016-03-10 | Disposition: A | Discharge status: Home / Self Care | Payer: Self-pay | Source: Ambulatory Visit | Attending: Cardiology | Admitting: Cardiology

## 2016-03-10 DIAGNOSIS — E669 Obesity, unspecified: Secondary | ICD-10-CM | POA: Insufficient documentation

## 2016-03-10 DIAGNOSIS — R9431 Abnormal electrocardiogram [ECG] [EKG]: Secondary | ICD-10-CM | POA: Insufficient documentation

## 2016-03-10 DIAGNOSIS — Z6838 Body mass index (BMI) 38.0-38.9, adult: Secondary | ICD-10-CM | POA: Insufficient documentation

## 2016-03-10 NOTE — Progress Notes (Signed)
Echocardiogram 2D Echocardiogram has been performed.  Abigail Jacobson 03/10/2016, 10:04 AM

## 2016-03-16 MED FILL — OMEPRAZOLE DR 40 MG CAPSULE: 40 | 30 days supply | Qty: 30 | Fill #3

## 2016-03-26 MED FILL — METOPROLOL SUCC ER 25 MG TA: 25 | 30 days supply | Qty: 30 | Fill #1

## 2016-04-28 MED FILL — METOPROLOL SUCC ER 25 MG TA: 25 | 30 days supply | Qty: 30 | Fill #2

## 2016-05-31 MED FILL — METOPROLOL SUCC ER 25 MG TA: 25 | 30 days supply | Qty: 30 | Fill #3

## 2016-07-09 MED FILL — METOPROLOL SUCC ER 25 MG TA: 25 | 30 days supply | Qty: 30 | Fill #4

## 2016-08-03 ENCOUNTER — Encounter (HOSPITAL_BASED_OUTPATIENT_CLINIC_OR_DEPARTMENT_OTHER): Payer: Self-pay | Admitting: *Deleted

## 2016-08-03 ENCOUNTER — Emergency Department (HOSPITAL_BASED_OUTPATIENT_CLINIC_OR_DEPARTMENT_OTHER): Payer: Self-pay

## 2016-08-03 ENCOUNTER — Emergency Department (HOSPITAL_BASED_OUTPATIENT_CLINIC_OR_DEPARTMENT_OTHER)
Admission: EM | Admit: 2016-08-03 | Discharge: 2016-08-03 | Disposition: A | Discharge status: Home / Self Care | Payer: Self-pay | Attending: Emergency Medicine | Admitting: Emergency Medicine

## 2016-08-03 DIAGNOSIS — J45909 Unspecified asthma, uncomplicated: Secondary | ICD-10-CM | POA: Insufficient documentation

## 2016-08-03 DIAGNOSIS — Z79899 Other long term (current) drug therapy: Secondary | ICD-10-CM | POA: Insufficient documentation

## 2016-08-03 DIAGNOSIS — R002 Palpitations: Secondary | ICD-10-CM | POA: Insufficient documentation

## 2016-08-03 LAB — BASIC METABOLIC PANEL
Anion gap: 9 (ref 5–15)
BUN: 10 mg/dL (ref 6–20)
CO2: 25 mmol/L (ref 22–32)
Calcium: 9.6 mg/dL (ref 8.9–10.3)
Chloride: 102 mmol/L (ref 101–111)
Creatinine, Ser: 0.71 mg/dL (ref 0.44–1.00)
GFR calc Af Amer: >60 mL/min (ref >60)
GFR calc non Af Amer: >60 mL/min (ref >60)
Glucose, Bld: 93 mg/dL (ref 65–99)
Potassium: 3.7 mmol/L (ref 3.5–5.1)
Sodium: 136 mmol/L (ref 135–145)

## 2016-08-03 LAB — TROPONIN I: Troponin I: <0.03 ng/mL (ref <0.03)

## 2016-08-03 NOTE — ED Provider Notes (Signed)
Emergency Department Provider Note   I have reviewed the triage vital signs and the nursing notes.   HISTORY  Chief Complaint Chest Pain   HPI Abigail Jacobson is a 40 y.o. female with PMH of asthma, chest pain, and SVT presents to the emergency department for evaluation of heart palpitations that have been intermittent over the past 12 hours. The patient first noticed them yesterday evening. She denies any obvious provoking factor. No associated pain. The patient does get some tightness in her right arm only. No dyspnea. No fevers. She reports that she drinks several cups of coffee daily and had some this morning which did not improve her symptoms. She denies feeling lightheaded. She continues to be compliant with her daily metoprolol.    Past Medical History:  Diagnosis Date  . Asthma   . Fibroid   . Gastric ulcer   . Heart murmur   . Migraines   . Tonsillitis     Patient Active Problem List   Diagnosis Date Noted  . Abnormal ECG 12/27/2014  . Anemia 12/27/2014  . Anginal chest pain at rest [facility])   . Chest pain with moderate risk for cardiac etiology 12/26/2014    Past Surgical History:  Procedure Laterality Date  . No previous surgery      Current Outpatient Rx  . Order #: [id number] Class: Historical Med  . Order #: [id number] Class: Historical Med  . Order #: [id number] Class: Historical Med  . Order #: [id number] Class: Normal    Allergies Food  Family History  Problem Relation Age of Onset  . Hypertension Mother   . Stroke Maternal Grandmother   . Stroke Maternal Grandfather     Social History Social History  Substance Use Topics  . Smoking status: Never Smoker  . Smokeless tobacco: Never Used  . Alcohol use Yes     Comment: occassionally    Review of Systems  Constitutional: No fever/chills Eyes: No visual changes. ENT: No sore throat. Cardiovascular: Denies chest pain. Positive palpitations and right arm tightness.  Respiratory: Denies shortness  of breath. Gastrointestinal: No abdominal pain.  No nausea, no vomiting.  No diarrhea.  No constipation. Genitourinary: Negative for dysuria. Musculoskeletal: Negative for back pain. Skin: Negative for rash. Neurological: Negative for headaches, focal weakness or numbness.  10-point ROS otherwise negative.  ____________________________________________   PHYSICAL EXAM:  VITAL SIGNS: ED Triage Vitals  Enc Vitals Group     BP 08/03/16 1238 142/72     Pulse Rate 08/03/16 1238 86     Resp 08/03/16 1238 20     Temp 08/03/16 1238 98.3 F (36.8 C)     Temp Source 08/03/16 1238 Oral     SpO2 08/03/16 1238 99 %     Weight 08/03/16 1236 244 lb (110.7 kg)     Height 08/03/16 1236 5\' 7"  (1.702 m)     Pain Score 08/03/16 1236 2   Constitutional: Alert and oriented. Well appearing and in no acute distress. Eyes: Conjunctivae are normal.  Head: Atraumatic. Nose: No congestion/rhinnorhea. Mouth/Throat: Mucous membranes are moist.  Oropharynx non-erythematous. Neck: No stridor.  Cardiovascular: Normal rate, regular rhythm. Good peripheral circulation. Grossly normal heart sounds.   Respiratory: Normal respiratory effort.  No retractions. Lungs CTAB. Gastrointestinal: Soft and nontender. No distention.  Musculoskeletal: No lower extremity tenderness nor edema. No gross deformities of extremities. Neurologic:  Normal speech and language. No gross focal neurologic deficits are appreciated.  Skin:  Skin is warm, dry and intact. No rash noted.  ____________________________________________  LABS (all labs ordered are listed, but only abnormal results are displayed)  Labs Reviewed  BASIC METABOLIC PANEL  TROPONIN I   ____________________________________________  EKG   EKG Interpretation  Date/Time:  Tuesday August 03 2016 12:47:03 EST Ventricular Rate:  73 PR Interval:  122 QRS Duration: 92 QT Interval:  410 QTC Calculation: 451 R Axis:   52 Text Interpretation:  Normal sinus  rhythm ST & T wave abnormality, consider inferior ischemia Abnormal ECG agree. no sig change from previous. Confirmed by Johnney Killian, MD, Jeannie Done [phone]) on 08/03/2016 1:24:34 PM       ____________________________________________  RADIOLOGY  Dg Chest 2 View  Result Date: 08/03/2016 CLINICAL DATA:  Intermittent chest discomfort since last night with a fluttering feeling in the chest and right arm numbness. EXAM: CHEST  2 VIEW COMPARISON:  PA and lateral chest x-ray of August 05, 2015 FINDINGS: The lungs are adequately inflated and clear. The heart and pulmonary vascularity are normal. The mediastinum is normal in width. There is no pleural effusion. The bony thorax exhibits no acute abnormality. IMPRESSION: There is no active cardiopulmonary disease. Electronically Signed   By: David  Martinique M.D.   On: 08/03/2016 16:34    ____________________________________________   PROCEDURES  Procedure(s) performed:   Procedures  None ____________________________________________   INITIAL IMPRESSION / ASSESSMENT AND PLAN / ED COURSE  Pertinent labs & imaging results that were available during my care of the patient were reviewed by me and considered in my medical decision making (see chart for details).  Patient resents to the emergency department for evaluation of intermittent heart palpitations and right arm tightness. She has prior history of chest pain and palpitations. She's been compliant with her metoprolol. Not currently symptomatic. Given the right arm tightness and palpitations plan for chemistry and initial troponin. EKG is similar to prior tracing. PERC negative for PE.   Labs reviewed and unremarkable. Plan for PCP in the coming week with Cardiology follow up PRN.   At this time, I do not feel there is any life-threatening condition present. I have reviewed and discussed all results (EKG, imaging, lab, urine as appropriate), exam findings with patient. I have reviewed nursing notes and  appropriate previous records.  I feel the patient is safe to be discharged home without further emergent workup. Discussed usual and customary return precautions. Patient and family (if present) verbalize understanding and are comfortable with this plan.  Patient will follow-up with their primary care provider. If they do not have a primary care provider, information for follow-up has been provided to them. All questions have been answered.   ____________________________________________  FINAL CLINICAL IMPRESSION(S) / ED DIAGNOSES  Final diagnoses:  Heart palpitations     MEDICATIONS GIVEN DURING THIS VISIT:  None  NEW OUTPATIENT MEDICATIONS STARTED DURING THIS VISIT:  None   Note:  This document was prepared using Dragon voice recognition software and may include unintentional dictation errors.  Nanda Quinton, MD Emergency Medicine   Margette Fast, MD 08/03/16 [phone]

## 2016-08-03 NOTE — Discharge Instructions (Signed)

## 2016-08-03 NOTE — ED Notes (Signed)
Pt verbalized understanding of discharge instructions and denies any further questions at this time.   

## 2016-08-03 NOTE — ED Triage Notes (Signed)
Chest discomfort on and off since last night. Fluttery feeling. Drinking coffee this am did not make it change.

## 2016-08-03 NOTE — ED Notes (Signed)
Pt. Reports fluttery feeling in chest on mid sternal chest last night and today at 10:00 today.  Pt. Reports R arm tightness with the symptoms also.

## 2016-08-13 MED FILL — METOPROLOL SUCC ER 25 MG TA: 25 | 30 days supply | Qty: 30 | Fill #5

## 2016-09-03 MED FILL — MEDROXYPROGESTERONE 10 MG T: 10 | 10 days supply | Qty: 20 | Fill #0

## 2016-09-03 MED FILL — raNITIdine HCL 300 MG TABS: 300 | 90 days supply | Qty: 90 | Fill #0

## 2016-09-06 MED FILL — FERROUS SULFATE 325 MG TAB: 325 (65 FE) | 50 days supply | Qty: 100 | Fill #0

## 2016-09-06 MED FILL — METFORMIN HCL ER 500 MG TAB: 500 | 30 days supply | Qty: 30 | Fill #0

## 2016-09-17 MED FILL — METOPROLOL SUCC ER 25 MG TA: 25 | 30 days supply | Qty: 30 | Fill #6

## 2016-10-07 MED FILL — METFORMIN HCL ER 500 MG TAB: 500 | 30 days supply | Qty: 30 | Fill #1

## 2016-10-07 MED FILL — MEDROXYPROGESTERONE 10 MG T: 10 | 10 days supply | Qty: 20 | Fill #1

## 2016-10-26 MED FILL — METOPROLOL SUCC ER 25 MG TA: 25 | 30 days supply | Qty: 30 | Fill #7

## 2016-11-03 MED FILL — CEPHALEXIN 500 MG CAPSULE: 500 | 10 days supply | Qty: 30 | Fill #0

## 2016-11-03 MED FILL — PROMETHAZINE-DM SYRUP: 6.25-15 | 4 days supply | Qty: 120 | Fill #0

## 2016-11-30 MED FILL — METFORMIN HCL ER 500 MG TAB: 500 | 30 days supply | Qty: 30 | Fill #2

## 2016-12-27 MED FILL — METOPROLOL SUCC ER 25 MG TA: 25 | 30 days supply | Qty: 30 | Fill #8

## 2016-12-28 ENCOUNTER — Emergency Department (HOSPITAL_BASED_OUTPATIENT_CLINIC_OR_DEPARTMENT_OTHER): Payer: Self-pay

## 2016-12-28 ENCOUNTER — Encounter (HOSPITAL_BASED_OUTPATIENT_CLINIC_OR_DEPARTMENT_OTHER): Payer: Self-pay | Admitting: *Deleted

## 2016-12-28 ENCOUNTER — Emergency Department (HOSPITAL_BASED_OUTPATIENT_CLINIC_OR_DEPARTMENT_OTHER)
Admission: EM | Admit: 2016-12-28 | Discharge: 2016-12-28 | Disposition: A | Discharge status: Home / Self Care | Payer: Self-pay | Attending: Emergency Medicine | Admitting: Emergency Medicine

## 2016-12-28 DIAGNOSIS — Y990 Civilian activity done for income or pay: Secondary | ICD-10-CM | POA: Insufficient documentation

## 2016-12-28 DIAGNOSIS — Z79899 Other long term (current) drug therapy: Secondary | ICD-10-CM | POA: Insufficient documentation

## 2016-12-28 DIAGNOSIS — Y9389 Activity, other specified: Secondary | ICD-10-CM | POA: Insufficient documentation

## 2016-12-28 DIAGNOSIS — J45909 Unspecified asthma, uncomplicated: Secondary | ICD-10-CM | POA: Insufficient documentation

## 2016-12-28 DIAGNOSIS — M25531 Pain in right wrist: Secondary | ICD-10-CM | POA: Insufficient documentation

## 2016-12-28 DIAGNOSIS — Y929 Unspecified place or not applicable: Secondary | ICD-10-CM | POA: Insufficient documentation

## 2016-12-28 DIAGNOSIS — X503XXA Overexertion from repetitive movements, initial encounter: Secondary | ICD-10-CM | POA: Insufficient documentation

## 2016-12-28 NOTE — Discharge Instructions (Signed)
Take Advil or Aleve to help with pain and inflammation. Wear wrist splint as much as possible. Be sure to wear it at night while sleeping. Follow-up with orthopedics as listed below for further evaluation. Return to ED for worsening pain, no improvement of symptoms, numbness, weakness, injury.

## 2016-12-28 NOTE — ED Provider Notes (Addendum)
[facility] DEPT [facility] Provider Note   CSN: [id number] Arrival date & time: 12/28/16  1530   By signing my name below, I, Abigail Jacobson, attest that this documentation has been prepared under the direction and in the presence of . Electronically signed, Abigail Jacobson, ED Scribe. 12/28/16. 4:20 PM.  History   Chief Complaint Chief Complaint  Patient presents with  . Wrist Pain   The history is provided by the patient and medical records. No language interpreter was used.    Abigail Jacobson is a 41 y.o. female who presents to the Emergency Department with concern for R wrist pain x 4 days. Associated weakness noted. She describes sharp, shooting pain radiating up to the elbow. She states her pain is worsened with rotating the wrist. Pt has applied ice to the area without relief. No medications attempted at home. 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 Pt reportedly L handed. No h/o similar symptoms noted. No h/o surgery or trauma to pt's R wrist. No recent trauma, swelling, loss of sensation, h/o carpal tunnel or any other complaints noted at this time.    Past Medical History:  Diagnosis Date  . Asthma   . Fibroid   . Gastric ulcer   . Heart murmur   . Migraines   . Tonsillitis     Patient Active Problem List   Diagnosis Date Noted  . Abnormal ECG 12/27/2014  . Anemia 12/27/2014  . Anginal chest pain at rest [facility])   . Chest pain with moderate risk for cardiac etiology 12/26/2014    Past Surgical History:  Procedure Laterality Date  . No previous surgery      OB History    Gravida Para Term Preterm AB Living   0 0 0 0 0 0   SAB TAB Ectopic Multiple Live Births   0 0 0 0         Home Medications    Prior to Admission medications   Medication Sig Start Date End Date Taking? Authorizing Provider  albuterol (PROVENTIL HFA;VENTOLIN HFA) 108 (90 BASE)  MCG/ACT inhaler Inhale into the lungs every 6 (six) hours as needed for wheezing or shortness of breath.    Historical Provider, MD  Cholecalciferol (VITAMIN D PO) Take 1 tablet by mouth daily.    Historical Provider, MD  [facility] Liver Oil (COD LIVER PO) Take 1 tablet by mouth daily.    Historical Provider, MD  metoprolol succinate (TOPROL XL) 25 MG 24 hr tablet Take 1 tablet (25 mg total) by mouth daily. 02/25/16   Lelon Perla, MD    Family History Family History  Problem Relation Age of Onset  . Hypertension Mother   . Stroke Maternal Grandmother   . Stroke Maternal Grandfather     Social History Social History  Substance Use Topics  . Smoking status: Never Smoker  . Smokeless tobacco: Never Used  . Alcohol use Yes     Comment: occassionally     Allergies   Food   Review of Systems Review of Systems  Constitutional: Negative for appetite change, chills and fever.  Eyes: Negative for photophobia and visual disturbance.  Respiratory: Negative for chest tightness and shortness of breath.   Cardiovascular: Negative for chest pain and palpitations.  Gastrointestinal: Negative for nausea and vomiting.  Musculoskeletal: Positive for arthralgias. Negative for joint swelling and myalgias.  Skin: Negative for rash and wound.  Neurological: Positive for weakness. Negative for dizziness, facial asymmetry, light-headedness, numbness and headaches.  Hematological: Does not bruise/bleed easily.     Physical Exam Updated Vital Signs BP 131/78   Pulse (!) 103   Temp 98.3 F (36.8 C)   Resp 18   Ht 5\' 7"  (1.702 m)   Wt 249 lb (112.9 kg)   LMP 11/29/2016   SpO2 100%   BMI 39.00 kg/m   Physical Exam  Constitutional: She is oriented to person, place, and time. She appears well-developed and well-nourished. No distress.  HENT:  Head: Normocephalic and atraumatic.  Nose: Nose normal.  Eyes: Conjunctivae and EOM are normal. Right eye exhibits no discharge. Left eye exhibits no  discharge. No scleral icterus.  Neck: Normal range of motion. Neck supple.  Cardiovascular: Normal rate, regular rhythm, normal heart sounds and intact distal pulses.  Exam reveals no gallop and no friction rub.   No murmur heard. Pulmonary/Chest: Effort normal and breath sounds normal. No respiratory distress.  Abdominal: Soft. Bowel sounds are normal. She exhibits no distension. There is no tenderness. There is no guarding.  Musculoskeletal: Normal range of motion. She exhibits tenderness (Positive Tinel's sign; negative Phalen's). She exhibits no edema or deformity.  Pain with active extension of the wrist. Otherwise full active and passive ROM of R wrist.  Normal sensation. No objective signs of numbness. No visible deformity or swelling.   Neurological: She is alert and oriented to person, place, and time. No sensory deficit. She exhibits normal muscle tone. Coordination normal.  Skin: Skin is warm and dry. No rash noted.  Psychiatric: She has a normal mood and affect.  Nursing note and vitals reviewed.    ED Treatments / Results  DIAGNOSTIC STUDIES: Oxygen Saturation is 100% on RA, NL by my interpretation.    COORDINATION OF CARE: 4:25 PM-Discussed next steps with pt. Pt verbalized understanding and is agreeable with the plan. Will order imaging.   Labs (all labs ordered are listed, but only abnormal results are displayed) Labs Reviewed - No data to display  EKG  EKG Interpretation None       Radiology Dg Wrist Complete Right  Result Date: 12/28/2016 CLINICAL DATA:  Pain for 4 days. EXAM: RIGHT WRIST - COMPLETE 3+ VIEW COMPARISON:  None. FINDINGS: Frontal, oblique, lateral, and ulnar deviation scaphoid images were obtained. There is no fracture or dislocation. Joint spaces appear normal. No erosive change. IMPRESSION: No fracture or dislocation.  No evident arthropathy. Electronically Signed   By: Lowella Grip III M.D.   On: 12/28/2016 16:49    Procedures Procedures  (including critical care time)  Medications Ordered in ED Medications - No data to display   Initial Impression / Assessment and Plan / ED Course  I have reviewed the triage vital signs and the nursing notes.  Pertinent labs & imaging results that were available during my care of the patient were reviewed by me and considered in my medical decision making (see chart for details).     Patient's history and symptoms concerning for carpal tunnel syndrome versus dislocation versus fracture versus musculoskeletal pain. 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 X-ray was negative for fracture, dislocation and arthropathy. Wrist appears neurovascularly intact. Patient reports no numbness anywhere else on her body with no concern for any other acute process at this time. Symptoms likely due to carpal tunnel  syndrome considering repetitive use of wrist and hands at work. We will give volar wrist splint and advised her to follow up with Ortho for further evaluation if needed. Advised to take Advil or Aleve for pain and inflammation. Return precautions given.   Final Clinical Impressions(s) / ED Diagnoses   Final diagnoses:  Right wrist pain    New Prescriptions New Prescriptions   No medications on file   I personally performed the services described in this documentation, which was scribed in my presence. The recorded information has been reviewed and is accurate.    Delia Heady, PA-C 12/28/16 1701    [name], MD 01/01/17 0915    Delia Heady, PA-C 01/12/17 1540    [name], MD 01/15/17 [phone]

## 2016-12-28 NOTE — ED Triage Notes (Signed)
Pt c/o right wrist pain which radiates up to elbow x 4 days

## 2016-12-30 MED FILL — METFORMIN HCL ER 500 MG TAB: 500 | 30 days supply | Qty: 30 | Fill #3

## 2017-02-09 MED FILL — METFORMIN HCL ER 500 MG TAB: 500 | 30 days supply | Qty: 30 | Fill #4

## 2017-02-09 MED FILL — METOPROLOL SUCC ER 25 MG TA: 25 | 30 days supply | Qty: 30 | Fill #9

## 2017-03-18 MED FILL — METFORMIN HCL ER 500 MG TAB: 500 | 30 days supply | Qty: 30 | Fill #5

## 2017-04-04 MED FILL — METOPROLOL SUCC ER 25 MG TA: 25 | 30 days supply | Qty: 30 | Fill #0

## 2017-08-15 MED FILL — MEDROXYPROGESTERONE 10 MG T: 10 | 10 days supply | Qty: 20 | Fill #0

## 2017-08-15 MED FILL — raNITIdine HCL 300 MG TABS: 300 | 90 days supply | Qty: 90 | Fill #0

## 2017-08-15 MED FILL — FERROUS SULFATE 325 MG TAB: 325 (65 FE) | 150 days supply | Qty: 300 | Fill #0

## 2017-08-15 MED FILL — METOPROLOL SUCC ER 25 MG TA: 25 | 90 days supply | Qty: 90 | Fill #0

## 2017-08-15 MED FILL — METFORMIN HCL ER 500 MG TAB: 500 | 90 days supply | Qty: 90 | Fill #0

## 2017-12-07 ENCOUNTER — Emergency Department (HOSPITAL_BASED_OUTPATIENT_CLINIC_OR_DEPARTMENT_OTHER)
Admission: EM | Admit: 2017-12-07 | Discharge: 2017-12-07 | Disposition: A | Discharge status: Home / Self Care | Payer: Self-pay | Attending: Emergency Medicine | Admitting: Emergency Medicine

## 2017-12-07 ENCOUNTER — Other Ambulatory Visit: Payer: Self-pay

## 2017-12-07 ENCOUNTER — Encounter (HOSPITAL_BASED_OUTPATIENT_CLINIC_OR_DEPARTMENT_OTHER): Payer: Self-pay

## 2017-12-07 DIAGNOSIS — E1140 Type 2 diabetes mellitus with diabetic neuropathy, unspecified: Secondary | ICD-10-CM | POA: Insufficient documentation

## 2017-12-07 DIAGNOSIS — G629 Polyneuropathy, unspecified: Secondary | ICD-10-CM

## 2017-12-07 DIAGNOSIS — Z79899 Other long term (current) drug therapy: Secondary | ICD-10-CM | POA: Insufficient documentation

## 2017-12-07 DIAGNOSIS — Z7984 Long term (current) use of oral hypoglycemic drugs: Secondary | ICD-10-CM | POA: Insufficient documentation

## 2017-12-07 HISTORY — DX: Type 2 diabetes mellitus without complications: E11.9

## 2017-12-07 LAB — CBC WITH DIFFERENTIAL/PLATELET
Basophils Absolute: 0 10*3/uL (ref 0.0–0.1)
Basophils Relative: 0 %
Eosinophils Absolute: 0.1 10*3/uL (ref 0.0–0.7)
Eosinophils Relative: 2 %
HCT: 32.9 % — ABNORMAL LOW (ref 36.0–46.0)
Hemoglobin: 11.5 g/dL — ABNORMAL LOW (ref 12.0–15.0)
Lymphocytes Relative: 36 %
Lymphs Abs: 2.6 10*3/uL (ref 0.7–4.0)
MCH: 31.5 pg (ref 26.0–34.0)
MCHC: 35 g/dL (ref 30.0–36.0)
MCV: 90.1 fL (ref 78.0–100.0)
Monocytes Absolute: 0.5 10*3/uL (ref 0.1–1.0)
Monocytes Relative: 7 %
Neutro Abs: 4 10*3/uL (ref 1.7–7.7)
Neutrophils Relative %: 55 %
Platelets: 286 10*3/uL (ref 150–400)
RBC: 3.65 MIL/uL — ABNORMAL LOW (ref 3.87–5.11)
RDW: 13.1 % (ref 11.5–15.5)
WBC: 7.3 10*3/uL (ref 4.0–10.5)

## 2017-12-07 LAB — BASIC METABOLIC PANEL
Anion gap: 9 (ref 5–15)
BUN: 14 mg/dL (ref 6–20)
CO2: 21 mmol/L — ABNORMAL LOW (ref 22–32)
Calcium: 8.9 mg/dL (ref 8.9–10.3)
Chloride: 105 mmol/L (ref 101–111)
Creatinine, Ser: 0.7 mg/dL (ref 0.44–1.00)
GFR calc Af Amer: >60 mL/min (ref >60)
GFR calc non Af Amer: >60 mL/min (ref >60)
Glucose, Bld: 116 mg/dL — ABNORMAL HIGH (ref 65–99)
Potassium: 4.1 mmol/L (ref 3.5–5.1)
Sodium: 135 mmol/L (ref 135–145)

## 2017-12-07 LAB — PREGNANCY, URINE: Preg Test, Ur: NEGATIVE

## 2017-12-07 LAB — CBG MONITORING, ED: Glucose-Capillary: 103 mg/dL — ABNORMAL HIGH (ref 65–99)

## 2017-12-07 LAB — MAGNESIUM: Magnesium: 1.9 mg/dL (ref 1.7–2.4)

## 2017-12-07 MED ORDER — TRAMADOL HCL 50 MG PO TABS: 50.0000 mg | ORAL_TABLET | Freq: Four times a day (QID) | ORAL | 0 refills | Status: AC | PRN

## 2017-12-07 MED ORDER — GABAPENTIN 100 MG PO CAPS: 100.0000 mg | ORAL_CAPSULE | Freq: Three times a day (TID) | ORAL | 0 refills | Status: AC

## 2017-12-07 MED ORDER — TRAMADOL HCL 50 MG PO TABS
50.0000 mg | ORAL_TABLET | Freq: Once | ORAL | Status: AC
  Administered 2017-12-07: 50 mg via ORAL
  Filled 2017-12-07: qty 1

## 2017-12-07 MED FILL — GABAPENTIN 100 MG CAPSULE: 100 | 10 days supply | Qty: 30 | Fill #0

## 2017-12-07 MED FILL — traMADol HCL 50 MG TABS: 50 | 3 days supply | Qty: 15 | Fill #0

## 2017-12-07 NOTE — ED Triage Notes (Signed)
Pt presents with bilat leg "burning". Pt works 12hr shifts/5 days a week standing on concrete. Pt able to ambulate to room with steady gate. Pt had tylenol PTA.

## 2017-12-07 NOTE — Discharge Instructions (Signed)
To help with your symptoms:  - You can start taking Gabapentin as prescribed. I've given you a low dose that can be increased by your primary doctor. - Take the Ultram only as needed for severe pain. Do not drive or operate heavy machinery when taking this. - Continue your iron supplements - Try to wear tight fitting compression socks to help with leg swelling while at work. Try to break often to elevate your legs to help with swelling.

## 2017-12-07 NOTE — ED Provider Notes (Signed)
[place] EMERGENCY DEPARTMENT Provider Note   CSN: [id number] Arrival date & time: 12/07/17  0130     History   Chief Complaint Chief Complaint  Patient presents with  . Leg Pain    HPI Abigail Jacobson is a 42 y.o. female.  HPI   43 yo F with PMHx as below here with bilateral leg pain. Pt states that for the last 3 weeks, she's been working more than usual. She works in a [employer] and is on her feet for 12 hours/day for 6 days/week. She's noticed increasing burning, stabbing pain in her bilateral legs for the past week. Sx worse at work. She has tingling, shooting pains in her b/l feet. No pain in her back. She does not wear boots or tight clothes at work. No trauma. No loss of bowel or bladder function. No alleviating factors. No other complaints.  Past Medical History:  Diagnosis Date  . Asthma   . Diabetes mellitus without complication ([place])   . Fibroid   . Gastric ulcer   . Heart murmur   . Migraines   . Tonsillitis     Patient Active Problem List   Diagnosis Date Noted  . Abnormal ECG 12/27/2014  . Anemia 12/27/2014  . Anginal chest pain at rest [facility])   . Chest pain with moderate risk for cardiac etiology 12/26/2014    Past Surgical History:  Procedure Laterality Date  . No previous surgery       OB History    Gravida  0   Para  0   Term  0   Preterm  0   AB  0   Living  0     SAB  0   TAB  0   Ectopic  0   Multiple  0   Live Births               Home Medications    Prior to Admission medications   Medication Sig Start Date End Date Taking? Authorizing Provider  albuterol (PROVENTIL HFA;VENTOLIN HFA) 108 (90 BASE) MCG/ACT inhaler Inhale into the lungs every 6 (six) hours as needed for wheezing or shortness of breath.   Yes [provider]  Cholecalciferol (VITAMIN D PO) Take 1 tablet by mouth daily.   Yes [provider]  Cod Liver Oil (COD LIVER PO) Take 1 tablet by mouth daily.   Yes [provider]  metFORMIN (GLUCOPHAGE) 500 MG tablet Take by mouth daily with breakfast.   Yes [provider]  ranitidine (ZANTAC) 150 MG tablet Take 150 mg by mouth 2 (two) times daily.   Yes [provider]  gabapentin (NEURONTIN) 100 MG capsule Take 1 capsule (100 mg total) by mouth 3 (three) times daily for 10 days. 12/07/17 12/17/17  Duffy Bruce, MD  metoprolol succinate (TOPROL XL) 25 MG 24 hr tablet Take 1 tablet (25 mg total) by mouth daily. 02/25/16   Lelon Perla, MD  traMADol (ULTRAM) 50 MG tablet Take 1 tablet (50 mg total) by mouth every 6 (six) hours as needed for severe pain. 12/07/17   Duffy Bruce, MD    Family History Family History  Problem Relation Age of Onset  . Hypertension Mother   . Stroke Maternal Grandmother   . Stroke Maternal Grandfather     Social History Social History   Tobacco Use  . Smoking status: Never Smoker  . Smokeless tobacco: Never Used  Substance Use Topics  . Alcohol use: Yes  Comment: occassionally  . Drug use: No     Allergies   Food   Review of Systems Review of Systems  Constitutional: Negative for chills, fatigue and fever.  HENT: Negative for congestion and rhinorrhea.   Eyes: Negative for visual disturbance.  Respiratory: Negative for cough, shortness of breath and wheezing.   Cardiovascular: Negative for chest pain and leg swelling.  Gastrointestinal: Negative for abdominal pain, diarrhea, nausea and vomiting.  Genitourinary: Negative for dysuria and flank pain.  Musculoskeletal: Positive for myalgias. Negative for neck pain and neck stiffness.  Skin: Negative for rash and wound.  Allergic/Immunologic: Negative for immunocompromised state.  Neurological: Positive for numbness. Negative for syncope, weakness and headaches.  All other systems reviewed and are negative.    Physical Exam Updated Vital Signs BP 129/76 (BP Location: Right Arm)   Pulse 72   Resp 18   Ht 5\' 7"  (1.702 m)    Wt 112.9 kg (249 lb)   LMP 11/16/2017   SpO2 99%   BMI 39.00 kg/m   Physical Exam  Constitutional: She is oriented to person, place, and time. She appears well-developed and well-nourished. No distress.  HENT:  Head: Normocephalic and atraumatic.  Eyes: Conjunctivae are normal.  Neck: Neck supple.  Cardiovascular: Normal rate, regular rhythm and normal heart sounds. Exam reveals no friction rub.  No murmur heard. Pulmonary/Chest: Effort normal and breath sounds normal. No respiratory distress. She has no wheezes. She has no rales.  Abdominal: She exhibits no distension.  Musculoskeletal: She exhibits no edema.  No midline or paraspinal lower back pain  Neurological: She is alert and oriented to person, place, and time. She exhibits normal muscle tone.  Skin: Skin is warm. Capillary refill takes less than 2 seconds.  Psychiatric: She has a normal mood and affect.  Nursing note and vitals reviewed.   LOWER EXTREMITY EXAM: BILATERAL  INSPECTION & PALPATION: No TTP. No swelling. No erythema.  SENSORY: sensation is intact to light touch in:  Superficial peroneal nerve distribution (over dorsum of foot) Deep peroneal nerve distribution (over first dorsal web space) Sural nerve distribution (over lateral aspect 5th metatarsal) Saphenous nerve distribution (over medial instep)  MOTOR:  + Motor EHL (great toe dorsiflexion) + FHL (great toe plantar flexion)  + TA (ankle dorsiflexion)  + GSC (ankle plantar flexion)  VASCULAR: 2+ dorsalis pedis and posterior tibialis pulses Capillary refill < 2 sec, toes warm and well-perfused  COMPARTMENTS: Soft, warm, well-perfused No pain with passive extension No parethesias   ED Treatments / Results  Labs (all labs ordered are listed, but only abnormal results are displayed) Labs Reviewed  CBC WITH DIFFERENTIAL/PLATELET - Abnormal; Notable for the following components:      Result Value   RBC 3.65 (*)    Hemoglobin 11.5 (*)    HCT  32.9 (*)    All other components within normal limits  BASIC METABOLIC PANEL - Abnormal; Notable for the following components:   CO2 21 (*)    Glucose, Bld 116 (*)    All other components within normal limits  CBG MONITORING, ED - Abnormal; Notable for the following components:   Glucose-Capillary 103 (*)    All other components within normal limits  MAGNESIUM  PREGNANCY, URINE    EKG None  Radiology No results found.  Procedures Procedures (including critical care time)  Medications Ordered in ED Medications  traMADol (ULTRAM) tablet 50 mg (50 mg Oral Given 12/07/17 0253)     Initial Impression / Assessment and  Plan / ED Course  I have reviewed the triage vital signs and the nursing notes.  Pertinent labs & imaging results that were available during my care of the patient were reviewed by me and considered in my medical decision making (see chart for details).     42 yo F with h/o DM here with bilateral leg pain. 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 Lab work reassuring, normal lytes. Hgb is at baseline. She is NVI distally with no signs of cord compression, cauda equina, or acute radiculopathy. No sx to suggest referred pain from lumbar/spinal etiology. Will place on ultram PRN, start low-dose gabapentin, and refer her to PCP. Compression, regular leg elevation encouraged.   Final Clinical Impressions(s) / ED Diagnoses   Final diagnoses:  Neuropathy    ED Discharge Orders        Ordered    traMADol (ULTRAM) 50 MG tablet  Every 6 hours PRN     12/07/17 0257    gabapentin (NEURONTIN) 100 MG capsule  3 times daily     12/07/17 0257       Duffy Bruce, MD 12/07/17 [phone]

## 2017-12-07 NOTE — ED Notes (Signed)
Pt reports she does not take her metformin as prescribed.

## 2017-12-30 MED FILL — METFORMIN HCL ER 500 MG TAB: 500 | 90 days supply | Qty: 90 | Fill #1

## 2018-01-09 MED FILL — METOPROLOL SUCCINATE ER 25: 25 | 90 days supply | Qty: 90 | Fill #1

## 2018-03-29 MED FILL — METFORMIN HCL ER 500 MG TAB: 500 | 30 days supply | Qty: 30 | Fill #0

## 2018-03-29 MED FILL — METOPROLOL SUCCINATE ER 25: 25 | 30 days supply | Qty: 30 | Fill #0

## 2018-04-28 MED FILL — FERROUS SULFATE 325 MG TAB: 325 (65 FE) | 100 days supply | Qty: 200 | Fill #0

## 2018-04-28 MED FILL — METFORMIN HCL ER 500 MG TAB: 500 | 90 days supply | Qty: 90 | Fill #0

## 2018-04-28 MED FILL — NORETHIN-ESTRAD-FERR 1-0.02: 1-20 | 84 days supply | Qty: 84 | Fill #0

## 2018-05-16 NOTE — Progress Notes (Deleted)
HPI: FU chest pain and SVT. Stress echo 4/16 normal.  Echocardiogram July 2017 showed normal LV function.  Since last seen  Current Outpatient Medications  Medication Sig Dispense Refill  . albuterol (PROVENTIL HFA;VENTOLIN HFA) 108 (90 BASE) MCG/ACT inhaler Inhale into the lungs every 6 (six) hours as needed for wheezing or shortness of breath.    . Cholecalciferol (VITAMIN D PO) Take 1 tablet by mouth daily.    . Cod Liver Oil (COD LIVER PO) Take 1 tablet by mouth daily.    Marland Kitchen gabapentin (NEURONTIN) 100 MG capsule Take 1 capsule (100 mg total) by mouth 3 (three) times daily for 10 days. 30 capsule 0  . metFORMIN (GLUCOPHAGE) 500 MG tablet Take by mouth daily with breakfast.    . metoprolol succinate (TOPROL XL) 25 MG 24 hr tablet Take 1 tablet (25 mg total) by mouth daily. 30 tablet 12  . ranitidine (ZANTAC) 150 MG tablet Take 150 mg by mouth 2 (two) times daily.    . traMADol (ULTRAM) 50 MG tablet Take 1 tablet (50 mg total) by mouth every 6 (six) hours as needed for severe pain. 15 tablet 0   No current facility-administered medications for this visit.      Past Medical History:  Diagnosis Date  . Asthma   . Diabetes mellitus without complication ([place])   . Fibroid   . Gastric ulcer   . Heart murmur   . Migraines   . Tonsillitis     Past Surgical History:  Procedure Laterality Date  . No previous surgery      Social History   Socioeconomic History  . Marital status: Single    Spouse name: Not on file  . Number of children: Not on file  . Years of education: Not on file  . Highest education level: Not on file  Occupational History  . Occupation: Warehouse manager: [employer]  . Financial resource strain: Not on file  . Food insecurity:    Worry: Not on file    Inability: Not on file  . Transportation needs:    Medical: Not on file    Non-medical: Not on file  Tobacco Use  . Smoking status: Never Smoker  . Smokeless  tobacco: Never Used  Substance and Sexual Activity  . Alcohol use: Yes    Comment: occassionally  . Drug use: No  . Sexual activity: Yes    Birth control/protection: None  Lifestyle  . Physical activity:    Days per week: Not on file    Minutes per session: Not on file  . Stress: Not on file  Relationships  . Social connections:    Talks on phone: Not on file    Gets together: Not on file    Attends religious service: Not on file    Active member of club or organization: Not on file    Attends meetings of clubs or organizations: Not on file    Relationship status: Not on file  . Intimate partner violence:    Fear of current or ex partner: Not on file    Emotionally abused: Not on file    Physically abused: Not on file    Forced sexual activity: Not on file  Other Topics Concern  . Not on file  Social History Narrative  . Not on file    Family History  Problem Relation Age of Onset  . Hypertension Mother   .  Stroke Maternal Grandmother   . Stroke Maternal Grandfather     ROS: no fevers or chills, productive cough, hemoptysis, dysphasia, odynophagia, melena, hematochezia, dysuria, hematuria, rash, seizure activity, orthopnea, PND, pedal edema, claudication. Remaining systems are negative.  Physical Exam: Well-developed well-nourished in no acute distress.  Skin is warm and dry.  HEENT is normal.  Neck is supple.  Chest is clear to auscultation with normal expansion.  Cardiovascular exam is regular rate and rhythm.  Abdominal exam nontender or distended. No masses palpated. Extremities show no edema. neuro grossly intact  ECG- personally reviewed  A/P  1  Kirk Ruths, MD

## 2018-05-17 MED FILL — METOPROLOL SUCCINATE ER 25: 25 | 30 days supply | Qty: 30 | Fill #0

## 2018-05-24 ENCOUNTER — Ambulatory Visit: Payer: Self-pay | Admitting: Cardiology

## 2018-05-24 DIAGNOSIS — R0989 Other specified symptoms and signs involving the circulatory and respiratory systems: Secondary | ICD-10-CM

## 2018-06-19 MED FILL — METOPROLOL SUCCINATE ER 25: 25 | 30 days supply | Qty: 30 | Fill #1

## 2018-07-17 MED FILL — METOPROLOL SUCCINATE ER 25: 25 | 30 days supply | Qty: 30 | Fill #2

## 2018-08-07 MED FILL — metFORMIN HCL ER 500 MG TB2: 500 | 90 days supply | Qty: 90 | Fill #1

## 2018-08-14 MED FILL — METOPROLOL SUCCINATE ER 25: 25 | 30 days supply | Qty: 30 | Fill #3

## 2018-09-27 MED FILL — METOPROLOL SUCCINATE ER 25: 25 | 30 days supply | Qty: 30 | Fill #4

## 2018-11-08 MED FILL — METOPROLOL SUCCINATE ER 25: 25 | 30 days supply | Qty: 30 | Fill #5

## 2018-11-13 MED FILL — metFORMIN HCL ER 500 MG TB2: 500 | 30 days supply | Qty: 30 | Fill #0

## 2019-05-28 MED FILL — MELOXICAM 15 MG TABLET: 15 | 30 days supply | Qty: 30 | Fill #0

## 2019-07-31 MED FILL — METOPROLOL SUCCINATE ER 25: 25 | 30 days supply | Qty: 30 | Fill #0

## 2019-07-31 MED FILL — DICYCLOMINE 20 MG TABLET: 20 | 30 days supply | Qty: 120 | Fill #0

## 2019-07-31 MED FILL — DEXILANT DR 60 MG CAPSULE: 60 | 30 days supply | Qty: 30 | Fill #0

## 2019-09-04 MED FILL — DEXILANT DR 60 MG CAPSULE: 60 | 30 days supply | Qty: 30 | Fill #1

## 2019-09-04 MED FILL — METOPROLOL SUCCINATE ER 25: 25 | 30 days supply | Qty: 30 | Fill #1

## 2019-10-04 MED FILL — METOPROLOL SUCCINATE ER 25: 25 | 30 days supply | Qty: 30 | Fill #2

## 2019-10-04 MED FILL — DEXILANT DR 60 MG CAPSULE: 60 | 30 days supply | Qty: 30 | Fill #2

## 2019-11-19 MED FILL — METOPROLOL SUCCINATE ER 25: 25 | 30 days supply | Qty: 30 | Fill #0

## 2019-11-30 MED FILL — METHOCARBAMOL 500 MG TABS: 500 | 5 days supply | Qty: 15 | Fill #0

## 2019-12-05 MED FILL — BLISOVI FE 1/20 1-20 MG-MCG: 1-20 | 84 days supply | Qty: 84 | Fill #0

## 2019-12-05 MED FILL — MELOXICAM 15 MG TABLET: 15 | 30 days supply | Qty: 30 | Fill #0

## 2019-12-05 MED FILL — METHOCARBAMOL 500 MG TABS: 500 | 30 days supply | Qty: 240 | Fill #0

## 2019-12-06 ENCOUNTER — Other Ambulatory Visit: Payer: Self-pay | Admitting: Family Medicine

## 2019-12-06 DIAGNOSIS — Z1231 Encounter for screening mammogram for malignant neoplasm of breast: Secondary | ICD-10-CM

## 2019-12-31 MED FILL — DEXILANT DR 60 MG CAPSULE: 60 | 30 days supply | Qty: 30 | Fill #0

## 2019-12-31 MED FILL — METOPROLOL SUCCINATE ER 25: 25 | 30 days supply | Qty: 30 | Fill #0

## 2020-01-05 ENCOUNTER — Emergency Department (HOSPITAL_BASED_OUTPATIENT_CLINIC_OR_DEPARTMENT_OTHER)
Admission: EM | Admit: 2020-01-05 | Discharge: 2020-01-05 | Disposition: A | Discharge status: Home / Self Care | Payer: 59 | Attending: Emergency Medicine | Admitting: Emergency Medicine

## 2020-01-05 ENCOUNTER — Other Ambulatory Visit: Payer: Self-pay

## 2020-01-05 ENCOUNTER — Encounter (HOSPITAL_BASED_OUTPATIENT_CLINIC_OR_DEPARTMENT_OTHER): Payer: Self-pay

## 2020-01-05 DIAGNOSIS — Z7984 Long term (current) use of oral hypoglycemic drugs: Secondary | ICD-10-CM | POA: Diagnosis not present

## 2020-01-05 DIAGNOSIS — J45909 Unspecified asthma, uncomplicated: Secondary | ICD-10-CM | POA: Insufficient documentation

## 2020-01-05 DIAGNOSIS — E119 Type 2 diabetes mellitus without complications: Secondary | ICD-10-CM | POA: Insufficient documentation

## 2020-01-05 DIAGNOSIS — R590 Localized enlarged lymph nodes: Secondary | ICD-10-CM | POA: Diagnosis present

## 2020-01-05 NOTE — Discharge Instructions (Signed)
Please read and follow all provided instructions.  Your diagnoses today include:  1. Lymphadenopathy, occipital     Tests performed today include:  Vital signs. See below for your results today.   Medications prescribed:  Please use over-the-counter NSAID medications (ibuprofen, naproxen) as directed on the packaging for pain.   Home care instructions:  Follow any educational materials contained in this packet.  Follow-up instructions: Please follow-up with your primary care provider as needed for further evaluation of your symptoms.  Return instructions:   Please return to the Emergency Department if you experience worsening symptoms.   Please return if you have any other emergent concerns.  Additional Information:  Your vital signs today were: BP (!) 147/93 (BP Location: Left Arm)   Pulse 84   Temp 98.4 F (36.9 C) (Oral)   Resp 20   Ht 5\' 7"  (1.702 m)   Wt 107.5 kg   LMP 12/30/2019   SpO2 99%   BMI 37.12 kg/m  If your blood pressure (BP) was elevated above 135/85 this visit, please have this repeated by your doctor within one month. ---------------

## 2020-01-05 NOTE — ED Provider Notes (Signed)
[facility] EMERGENCY DEPARTMENT Provider Note   CSN: [id number] Arrival date & time: 01/05/20  1739     History Chief Complaint  Patient presents with  . Neck Pain    Abigail Jacobson is a 44 y.o. female.  Patient presents to the emergency department for 2 knots that came up on the back of her head and neck over the past couple of days.  She states that initially she thought she just slept on her neck wrong.  She went and got her haircut and it was noted that she has a couple of lumps.  One is in the left occipital area and the other one is posterior to the right mandible on the back of the neck.  No treatments prior to arrival.  Patient denies fever, ear pain, runny nose, sore throat.  She denies any cough, weight loss, shortness of breath.  No treatments prior to arrival.        Past Medical History:  Diagnosis Date  . Asthma   . Diabetes mellitus without complication ([place])   . Fibroid   . Gastric ulcer   . Heart murmur   . Migraines   . Tonsillitis     Patient Active Problem List   Diagnosis Date Noted  . Abnormal ECG 12/27/2014  . Anemia 12/27/2014  . Anginal chest pain at rest [facility])   . Chest pain with moderate risk for cardiac etiology 12/26/2014    Past Surgical History:  Procedure Laterality Date  . No previous surgery       OB History    Gravida  0   Para  0   Term  0   Preterm  0   AB  0   Living  0     SAB  0   TAB  0   Ectopic  0   Multiple  0   Live Births              Family History  Problem Relation Age of Onset  . Hypertension Mother   . Stroke Maternal Grandmother   . Stroke Maternal Grandfather     Social History   Tobacco Use  . Smoking status: Never Smoker  . Smokeless tobacco: Never Used  Substance Use Topics  . Alcohol use: Yes    Comment: occassionally  . Drug use: No    Home Medications Prior to Admission medications   Medication Sig Start Date End Date Taking? Authorizing Provider  albuterol  (PROVENTIL HFA;VENTOLIN HFA) 108 (90 BASE) MCG/ACT inhaler Inhale into the lungs every 6 (six) hours as needed for wheezing or shortness of breath.    [provider]  Cholecalciferol (VITAMIN D PO) Take 1 tablet by mouth daily.    [provider]  Cod Liver Oil (COD LIVER PO) Take 1 tablet by mouth daily.    [provider]  gabapentin (NEURONTIN) 100 MG capsule Take 1 capsule (100 mg total) by mouth 3 (three) times daily for 10 days. 12/07/17 12/17/17  Duffy Bruce, MD  metFORMIN (GLUCOPHAGE) 500 MG tablet Take by mouth daily with breakfast.    [provider]  metoprolol succinate (TOPROL XL) 25 MG 24 hr tablet Take 1 tablet (25 mg total) by mouth daily. 02/25/16   Lelon Perla, MD  ranitidine (ZANTAC) 150 MG tablet Take 150 mg by mouth 2 (two) times daily.    [provider]  traMADol (ULTRAM) 50 MG tablet Take 1 tablet (50 mg total) by mouth every  6 (six) hours as needed for severe pain. 12/07/17   Duffy Bruce, MD    Allergies    Food  Review of Systems   Review of Systems  Constitutional: Negative for diaphoresis, fever and unexpected weight change.  HENT: Negative for ear pain and sore throat.   Respiratory: Negative for cough and shortness of breath.   Cardiovascular: Negative for chest pain.  Skin: Negative for color change and wound.       + bumps on neck    Physical Exam Updated Vital Signs BP (!) 147/93 (BP Location: Left Arm)   Pulse 84   Temp 98.4 F (36.9 C) (Oral)   Resp 20   Ht 5\' 7"  (1.702 m)   Wt 107.5 kg   LMP 12/30/2019   SpO2 99%   BMI 37.12 kg/m   Physical Exam Vitals and nursing note reviewed.  Constitutional:      Appearance: She is well-developed.  HENT:     Head: Normocephalic and atraumatic.  Eyes:     Conjunctiva/sclera: Conjunctivae normal.  Pulmonary:     Effort: No respiratory distress.  Musculoskeletal:     Cervical back: Normal range of motion and neck supple.  Skin:    General:  Skin is warm and dry.     Comments: Patient with 2 mobile subcutaneous nodules, one in the left occipital area, one on the posterolateral neck several centimeters posterior to where I would expect posterior cervical chain. No overlying redness, warmth, or significant tenderness.   Neurological:     Mental Status: She is alert.     ED Results / Procedures / Treatments   Labs (all labs ordered are listed, but only abnormal results are displayed) Labs Reviewed - No data to display  EKG None  Radiology No results found.  Procedures Procedures (including critical care time)  Medications Ordered in ED Medications - No data to display  ED Course  I have reviewed the triage vital signs and the nursing notes.  Pertinent labs & imaging results that were available during my care of the patient were reviewed by me and considered in my medical decision making (see chart for details).  Patient seen and examined.  Patient's exam is overall reassuring.  I do not suspect any infections.  No indications for I&D.  No other systemic symptoms of illness.  Discussed that these areas could be reactive lymph nodes are also potentially small epidermoid cysts.  We discussed watchful waiting at this point.  I encouraged PCP follow-up if the areas persist after several weeks and return to the emergency department if one of the areas becomes larger, more tender or painful, or she develops other symptoms.  Patient seems reassured and is in agreement with plan.  Vital signs reviewed and are as follows: BP (!) 147/93 (BP Location: Left Arm)   Pulse 84   Temp 98.4 F (36.9 C) (Oral)   Resp 20   Ht 5\' 7"  (1.702 m)   Wt 107.5 kg   LMP 12/30/2019   SpO2 99%   BMI 37.12 kg/m        MDM Rules/Calculators/A&P                      Patient with 2 small lumps in the back of the neck and scalp.  These have any been present for several days.  No other concerning systemic symptoms of illness or B symptoms.  I  do not feel that patient requires work-up or further  evaluation at this time.  She will monitor the area closely and follow-up as needed.    Final Clinical Impression(s) / ED Diagnoses Final diagnoses:  Lymphadenopathy, occipital    Rx / DC Orders ED Discharge Orders    None       Carlisle Cater, PA-C 01/05/20 1810    Blanchie Dessert, MD 01/07/20 0002

## 2020-01-05 NOTE — ED Triage Notes (Signed)
Neck pain x 2 days. Pt reports noticing a knot at times. Denies any specific injury.

## 2020-01-25 ENCOUNTER — Other Ambulatory Visit: Payer: Self-pay

## 2020-01-25 ENCOUNTER — Encounter (HOSPITAL_BASED_OUTPATIENT_CLINIC_OR_DEPARTMENT_OTHER): Payer: Self-pay | Admitting: *Deleted

## 2020-01-25 ENCOUNTER — Emergency Department (HOSPITAL_BASED_OUTPATIENT_CLINIC_OR_DEPARTMENT_OTHER)
Admission: EM | Admit: 2020-01-25 | Discharge: 2020-01-25 | Disposition: A | Discharge status: Home / Self Care | Payer: 59 | Attending: Emergency Medicine | Admitting: Emergency Medicine

## 2020-01-25 DIAGNOSIS — M79651 Pain in right thigh: Secondary | ICD-10-CM | POA: Diagnosis not present

## 2020-01-25 DIAGNOSIS — E119 Type 2 diabetes mellitus without complications: Secondary | ICD-10-CM | POA: Diagnosis not present

## 2020-01-25 DIAGNOSIS — Z7984 Long term (current) use of oral hypoglycemic drugs: Secondary | ICD-10-CM | POA: Diagnosis not present

## 2020-01-25 DIAGNOSIS — M79652 Pain in left thigh: Secondary | ICD-10-CM | POA: Insufficient documentation

## 2020-01-25 DIAGNOSIS — Z79899 Other long term (current) drug therapy: Secondary | ICD-10-CM | POA: Insufficient documentation

## 2020-01-25 DIAGNOSIS — J45909 Unspecified asthma, uncomplicated: Secondary | ICD-10-CM | POA: Insufficient documentation

## 2020-01-25 DIAGNOSIS — M545 Low back pain, unspecified: Secondary | ICD-10-CM

## 2020-01-25 DIAGNOSIS — M5489 Other dorsalgia: Secondary | ICD-10-CM | POA: Diagnosis present

## 2020-01-25 NOTE — ED Notes (Signed)
Lower back pain that began this AM that radiated down anterior portion of thighs bilat, heavy lifting for [employer] job. Attempted to see PCP but was not available. Denies taken any meds for intervention.

## 2020-01-25 NOTE — ED Provider Notes (Signed)
[facility] EMERGENCY DEPARTMENT Provider Note   CSN: [id number] Arrival date & time: 01/25/20  1524     History Chief Complaint  Patient presents with  . Back Pain    Abigail Jacobson is a 44 y.o. female.  She is here with a complaint of pain in her low back and bilateral thighs with been going on and off for days.  She blames it on working 7 days a week 12 hours a day at [employer] job doing heavy lifting a lot of walking.  She has access to muscle relaxants but they make her too drowsy.  Sometimes taking ibuprofen.  No weakness or numbness.  Has an appointment with her PCP.  No fevers chills bowel or bladder incontinence.  The history is provided by the patient.  Back Pain Location:  Lumbar spine Quality:  Aching and cramping Radiates to:  L thigh and R thigh Pain severity:  Moderate Pain is:  Same all the time Onset quality:  Gradual Timing:  Intermittent Progression:  Unchanged Chronicity:  New Context: physical stress   Context: not falling   Relieved by:  Heating pad Worsened by:  Movement Ineffective treatments:  Bed rest Associated symptoms: leg pain   Associated symptoms: no abdominal pain, no bladder incontinence, no bowel incontinence, no chest pain, no dysuria, no fever, no paresthesias, no tingling and no weakness        Past Medical History:  Diagnosis Date  . Asthma   . Diabetes mellitus without complication ([place])   . Fibroid   . Gastric ulcer   . Heart murmur   . Migraines   . Tonsillitis     Patient Active Problem List   Diagnosis Date Noted  . Abnormal ECG 12/27/2014  . Anemia 12/27/2014  . Anginal chest pain at rest [facility])   . Chest pain with moderate risk for cardiac etiology 12/26/2014    Past Surgical History:  Procedure Laterality Date  . No previous surgery       OB History    Gravida  0   Para  0   Term  0   Preterm  0   AB  0   Living  0     SAB  0   TAB  0   Ectopic  0   Multiple  0   Live Births              Family History  Problem Relation Age of Onset  . Hypertension Mother   . Stroke Maternal Grandmother   . Stroke Maternal Grandfather     Social History   Tobacco Use  . Smoking status: Never Smoker  . Smokeless tobacco: Never Used  Substance Use Topics  . Alcohol use: Yes    Comment: occassionally  . Drug use: No    Home Medications Prior to Admission medications   Medication Sig Start Date End Date Taking? Authorizing Provider  metoprolol succinate (TOPROL XL) 25 MG 24 hr tablet Take 1 tablet (25 mg total) by mouth daily. 02/25/16  Yes Lelon Perla, MD  albuterol (PROVENTIL HFA;VENTOLIN HFA) 108 (90 BASE) MCG/ACT inhaler Inhale into the lungs every 6 (six) hours as needed for wheezing or shortness of breath.    [provider]  Cholecalciferol (VITAMIN D PO) Take 1 tablet by mouth daily.    [provider]  Cod Liver Oil (COD LIVER PO) Take 1 tablet by mouth daily.    [provider]  gabapentin (NEURONTIN) 100 MG  capsule Take 1 capsule (100 mg total) by mouth 3 (three) times daily for 10 days. 12/07/17 12/17/17  Duffy Bruce, MD  metFORMIN (GLUCOPHAGE) 500 MG tablet Take by mouth daily with breakfast.    [provider]  ranitidine (ZANTAC) 150 MG tablet Take 150 mg by mouth 2 (two) times daily.    [provider]  traMADol (ULTRAM) 50 MG tablet Take 1 tablet (50 mg total) by mouth every 6 (six) hours as needed for severe pain. 12/07/17   Duffy Bruce, MD    Allergies    Food  Review of Systems   Review of Systems  Constitutional: Negative for fever.  HENT: Negative for sore throat.   Eyes: Negative for visual disturbance.  Respiratory: Negative for shortness of breath.   Cardiovascular: Negative for chest pain.  Gastrointestinal: Negative for abdominal pain and bowel incontinence.  Genitourinary: Negative for bladder incontinence and dysuria.  Musculoskeletal: Positive for back pain. Negative for joint  swelling.  Skin: Negative for rash.  Neurological: Negative for tingling, weakness and paresthesias.    Physical Exam Updated Vital Signs BP 130/83 (BP Location: Right Arm)   Pulse 65   Temp 98.8 F (37.1 C) (Oral)   Resp 18   Ht 5\' 7"  (1.702 m)   Wt 107.5 kg   LMP 12/30/2019   SpO2 100%   BMI 37.12 kg/m   Physical Exam Constitutional:      Appearance: She is well-developed.  HENT:     Head: Normocephalic and atraumatic.  Eyes:     Conjunctiva/sclera: Conjunctivae normal.  Musculoskeletal:        General: No deformity. Normal range of motion.     Cervical back: Neck supple.     Right lower leg: No edema.     Left lower leg: No edema.  Skin:    General: Skin is warm and dry.     Capillary Refill: Capillary refill takes less than 2 seconds.  Neurological:     General: No focal deficit present.     Mental Status: She is alert.     GCS: GCS eye subscore is 4. GCS verbal subscore is 5. GCS motor subscore is 6.     Sensory: No sensory deficit.     Motor: No weakness.     Gait: Gait normal.     ED Results / Procedures / Treatments   Labs (all labs ordered are listed, but only abnormal results are displayed) Labs Reviewed - No data to display  EKG None  Radiology No results found.  Procedures Procedures (including critical care time)  Medications Ordered in ED Medications - No data to display  ED Course  I have reviewed the triage vital signs and the nursing notes.  Pertinent labs & imaging results that were available during my care of the patient were reviewed by me and considered in my medical decision making (see chart for details).    MDM Rules/Calculators/A&P                     Differential diagnosis includes musculoskeletal strain, radiculopathy, UTI, fracture.  She has normal neuro exam.  No red flags.  No indication for imaging.  Recommended NSAIDs.  We will give her a note for work.  Return instructions discussed.  Final Clinical Impression(s)  / ED Diagnoses Final diagnoses:  Acute bilateral low back pain without sciatica  Pain in both thighs    Rx / DC Orders ED Discharge Orders    None  Hayden Rasmussen, MD 01/26/20 1343

## 2020-01-25 NOTE — ED Triage Notes (Signed)
Back pain that feels like muscle spasms that started this am. She has pain from her feet up her legs. She is ambulatory.

## 2020-01-25 NOTE — Discharge Instructions (Signed)
You were seen in the emergency department for pain in your low back and thighs.  This is likely from overuse at work.  Please drink plenty of fluids and take ibuprofen 3 times a day with food on your stomach.  Please follow-up with your primary care doctor.  Return to the emergency department if any worsening or concerning symptoms.

## 2020-02-11 MED FILL — METOPROLOL SUCCINATE ER 25: 25 | 30 days supply | Qty: 30 | Fill #1

## 2020-02-12 ENCOUNTER — Ambulatory Visit: Payer: Self-pay

## 2020-02-20 ENCOUNTER — Ambulatory Visit
Admission: RE | Admit: 2020-02-20 | Discharge: 2020-02-20 | Disposition: A | Discharge status: Home / Self Care | Payer: 59 | Source: Ambulatory Visit | Attending: Family Medicine | Admitting: Family Medicine

## 2020-02-20 ENCOUNTER — Other Ambulatory Visit: Payer: Self-pay

## 2020-02-20 DIAGNOSIS — Z1231 Encounter for screening mammogram for malignant neoplasm of breast: Secondary | ICD-10-CM

## 2020-10-31 ENCOUNTER — Other Ambulatory Visit (HOSPITAL_COMMUNITY): Payer: Self-pay | Admitting: Family Medicine

## 2020-10-31 MED FILL — METOPROLOL SUCCINATE ER 25: 25 | 90 days supply | Qty: 90 | Fill #0

## 2020-11-03 ENCOUNTER — Telehealth: Payer: Self-pay

## 2020-11-03 NOTE — Telephone Encounter (Signed)
Referral sent to scheduling and notes on file from Abigail Miyamoto, [place], [phone]

## 2020-11-26 ENCOUNTER — Emergency Department (HOSPITAL_BASED_OUTPATIENT_CLINIC_OR_DEPARTMENT_OTHER): Payer: Self-pay

## 2020-11-26 ENCOUNTER — Other Ambulatory Visit: Payer: Self-pay

## 2020-11-26 ENCOUNTER — Emergency Department (HOSPITAL_BASED_OUTPATIENT_CLINIC_OR_DEPARTMENT_OTHER)
Admission: EM | Admit: 2020-11-26 | Discharge: 2020-11-26 | Disposition: A | Discharge status: Home / Self Care | Payer: Self-pay | Attending: Emergency Medicine | Admitting: Emergency Medicine

## 2020-11-26 ENCOUNTER — Encounter (HOSPITAL_BASED_OUTPATIENT_CLINIC_OR_DEPARTMENT_OTHER): Payer: Self-pay

## 2020-11-26 DIAGNOSIS — J45909 Unspecified asthma, uncomplicated: Secondary | ICD-10-CM | POA: Insufficient documentation

## 2020-11-26 DIAGNOSIS — E119 Type 2 diabetes mellitus without complications: Secondary | ICD-10-CM | POA: Insufficient documentation

## 2020-11-26 DIAGNOSIS — M25561 Pain in right knee: Secondary | ICD-10-CM | POA: Insufficient documentation

## 2020-11-26 DIAGNOSIS — Z7984 Long term (current) use of oral hypoglycemic drugs: Secondary | ICD-10-CM | POA: Insufficient documentation

## 2020-11-26 NOTE — Discharge Instructions (Addendum)
Your xray is normal.  Given your history of stomach ulcers, I only recommend tylenol/acetaminophen for pain. You can however apply topical voltaren (diclofenac) gel to your knee for added pain relief. Apply ice for 20 minutes at a time. Elevate as much as possible to help with swelling. Schedule point with the sports medicine specialist for further evaluation.

## 2020-11-26 NOTE — ED Provider Notes (Signed)
Abigail Jacobson   CSN: [id number] Arrival date & time: 11/26/20  1552     History Chief Complaint  Patient presents with  . Knee Pain    Abigail Jacobson is a 45 y.o. female with PMHx DM, asthma, gastric ulcer, presenting for evaluation of right knee pain that began about 3-4 days ago.  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  Symptoms began gradually a few days ago.  She states the beginning of her shift symptoms are minimal, however the more she walks on her right knee she develops more pain throughout the shift.  Pain is to the anterior aspect, medial to the patella.  Pain is intermittently sharp in nature and worse with weightbearing.  Rest makes her pain better.  No swelling, redness, injuries, wounds.  No prior injury to right knee.  The history is provided by the patient.       Past Medical History:  Diagnosis Date  . Asthma   . Diabetes mellitus without complication ([place])   . Fibroid   . Gastric ulcer   . Heart murmur   . Migraines   . Tonsillitis     Patient Active Problem List   Diagnosis Date Noted  . Abnormal ECG 12/27/2014  . Anemia 12/27/2014  . Anginal chest pain at rest [facility])   . Chest pain with moderate risk for cardiac etiology 12/26/2014    Past Surgical History:  Procedure Laterality Date  . No previous surgery       OB History    Gravida  0   Para  0   Term  0   Preterm  0   AB  0   Living  0     SAB  0   IAB  0   Ectopic  0   Multiple  0   Live Births              Family History  Problem Relation Age of Onset  . Hypertension Mother   . Stroke Maternal Grandmother   . Stroke Maternal Grandfather     Social History   Tobacco Use  . Smoking status: Never Smoker  . Smokeless tobacco: Never Used  Vaping Use  . Vaping Use: Never used  Substance Use Topics  . Alcohol use: Yes    Comment: occassionally  . Drug use: No     Home Medications Prior to Admission medications   Medication Sig Start Date End Date Taking? Authorizing Provider  albuterol (PROVENTIL HFA;VENTOLIN HFA) 108 (90 BASE) MCG/ACT inhaler Inhale into the lungs every 6 (six) hours as needed for wheezing or shortness of breath.    [provider]  Cholecalciferol (VITAMIN D PO) Take 1 tablet by mouth daily.    [provider]  Cod Liver Oil (COD LIVER PO) Take 1 tablet by mouth daily.    [provider]  gabapentin (NEURONTIN) 100 MG capsule Take 1 capsule (100 mg total) by mouth 3 (three) times daily for 10 days. 12/07/17 12/17/17  Duffy Bruce, MD  metFORMIN (GLUCOPHAGE) 500 MG tablet Take by mouth daily with breakfast.    [provider]  metoprolol succinate (TOPROL XL) 25 MG 24 hr tablet Take 1 tablet (25 mg total) by mouth daily. 02/25/16   Lelon Perla, MD  ranitidine (ZANTAC) 150 MG tablet Take 150 mg by mouth 2 (two) times daily.    [provider]  traMADol (ULTRAM) 50 MG tablet Take 1 tablet (50 mg total) by mouth every 6 (six) hours as needed for severe pain. 12/07/17   Duffy Bruce, MD    Allergies    Food  Review of Systems   Review of Systems  Musculoskeletal: Positive for arthralgias.  Skin: Negative for color change and wound.    Physical Exam Updated Vital Signs BP (!) 141/73 (BP Location: Left Arm)   Pulse 72   Temp 98.1 F (36.7 C) (Oral)   Resp 20   Ht 5\' 7"  (1.702 m)   Wt 110.2 kg   LMP 11/19/2020   SpO2 98%   BMI 38.06 kg/m   Physical Exam Vitals and nursing Jacobson reviewed.  Constitutional:      General: She is not in acute distress.    Appearance: She is well-developed.  HENT:     Head: Normocephalic and atraumatic.  Eyes:     Conjunctiva/sclera: Conjunctivae normal.  Cardiovascular:     Rate and Rhythm: Normal rate.  Pulmonary:     Effort: Pulmonary effort is normal.  Musculoskeletal:     Comments: Right knee without obvious large swelling.  No redness or warmth, no deformity.  TTP to the medial joint line special tests limited due to body habitus, though seemingly normal anterior/posterior drawer. Normal ROM, no crepitus.   Neurological:     Mental Status: She is alert.  Psychiatric:        Mood and Affect: Mood normal.        Behavior: Behavior normal.     ED Results / Procedures / Treatments   Labs (all labs ordered are listed, but only abnormal results are displayed) Labs Reviewed - No data to display  EKG None  Radiology DG Knee Complete 4 Views Right  Result Date: 11/26/2020 CLINICAL DATA:  Right knee pain for 4 days, worse with ambulation, worse medially EXAM: RIGHT KNEE - COMPLETE 4+ VIEW COMPARISON:  None. FINDINGS: Frontal, bilateral oblique, lateral views of the right knee are obtained. No fracture, subluxation, or dislocation. Joint spaces are well preserved. No joint effusion. IMPRESSION: 1. Unremarkable right knee. Electronically Signed   By: Randa Ngo M.D.   On: 11/26/2020 17:36    Procedures Procedures   Medications Ordered in ED Medications - No data to display  ED Course  I have reviewed the triage vital signs and the nursing notes.  Pertinent labs & imaging results that were available during my care of the patient were reviewed by me and considered in my medical decision making (see chart for details).    MDM Rules/Calculators/A&P                          Patient presenting for evaluation of gradual onset of right knee pain that began about 3 to 4 days ago.  She works for the [employer] and does quite a bit of walking throughout her shift.  Symptoms seem to worsen throughout her shift, improved with rest.  She is noted some mild swelling at home.  No injuries.  Examination is reassuring, no redness or warmth, no deformity.  Normal range of motion.  X-ray is negative.  Will apply compression sleeve for support and comfort, recommend continued RICE therapy, Tylenol for pain (history of gastric  ulcer), topical Voltaren gel.  Sports medicine referral provided for follow-up.  Work Jacobson provided.  Discussed results, findings, treatment and follow up. Patient advised of return precautions. Patient verbalized  understanding and agreed with plan.  Final Clinical Impression(s) / ED Diagnoses Final diagnoses:  Acute pain of right knee    Rx / DC Orders ED Discharge Orders    None       Caiden Arteaga, [place] N, PA-C 11/26/20 1809    Arnaldo Natal, MD 11/26/20 2321

## 2020-11-26 NOTE — ED Triage Notes (Signed)
Pt c/o right knee pain x 4 days-denies injury-NAD-steady gait

## 2020-12-29 ENCOUNTER — Other Ambulatory Visit (HOSPITAL_BASED_OUTPATIENT_CLINIC_OR_DEPARTMENT_OTHER): Payer: Self-pay

## 2020-12-29 MED FILL — Metoprolol Succinate Tab ER 24HR 25 MG (Tartrate Equiv): ORAL | 30 days supply | Qty: 30 | Fill #0 | Status: AC

## 2021-01-12 ENCOUNTER — Other Ambulatory Visit (HOSPITAL_BASED_OUTPATIENT_CLINIC_OR_DEPARTMENT_OTHER): Payer: Self-pay

## 2021-02-04 ENCOUNTER — Other Ambulatory Visit (HOSPITAL_BASED_OUTPATIENT_CLINIC_OR_DEPARTMENT_OTHER): Payer: Self-pay

## 2021-02-04 MED ORDER — AMOXICILLIN-POT CLAVULANATE 875-125 MG PO TABS
ORAL_TABLET | Freq: Two times a day (BID) | ORAL | 0 refills | Status: AC
  Filled 2021-02-04: qty 20, 10d supply, fill #0

## 2021-02-05 ENCOUNTER — Other Ambulatory Visit (HOSPITAL_BASED_OUTPATIENT_CLINIC_OR_DEPARTMENT_OTHER): Payer: Self-pay

## 2021-02-05 MED FILL — Metoprolol Succinate Tab ER 24HR 25 MG (Tartrate Equiv): ORAL | 30 days supply | Qty: 30 | Fill #1 | Status: AC

## 2021-02-24 ENCOUNTER — Other Ambulatory Visit (HOSPITAL_BASED_OUTPATIENT_CLINIC_OR_DEPARTMENT_OTHER): Payer: Self-pay

## 2021-03-18 ENCOUNTER — Other Ambulatory Visit (HOSPITAL_BASED_OUTPATIENT_CLINIC_OR_DEPARTMENT_OTHER): Payer: Self-pay

## 2021-03-18 MED ORDER — METOPROLOL SUCCINATE ER 25 MG PO TB24
ORAL_TABLET | ORAL | 2 refills | Status: AC
  Filled 2021-03-18: qty 90, 90d supply, fill #0
  Filled 2021-07-14: qty 90, 90d supply, fill #1
  Filled 2021-12-03: qty 90, 90d supply, fill #2

## 2021-03-18 MED FILL — Metoprolol Succinate Tab ER 24HR 25 MG (Tartrate Equiv): ORAL | 30 days supply | Qty: 30 | Fill #2 | Status: CN

## 2021-07-14 ENCOUNTER — Other Ambulatory Visit (HOSPITAL_BASED_OUTPATIENT_CLINIC_OR_DEPARTMENT_OTHER): Payer: Self-pay

## 2021-12-03 ENCOUNTER — Other Ambulatory Visit (HOSPITAL_BASED_OUTPATIENT_CLINIC_OR_DEPARTMENT_OTHER): Payer: Self-pay

## 2021-12-22 ENCOUNTER — Other Ambulatory Visit (HOSPITAL_BASED_OUTPATIENT_CLINIC_OR_DEPARTMENT_OTHER): Payer: Self-pay

## 2021-12-22 ENCOUNTER — Other Ambulatory Visit: Payer: Self-pay | Admitting: Family Medicine

## 2021-12-22 DIAGNOSIS — Z1231 Encounter for screening mammogram for malignant neoplasm of breast: Secondary | ICD-10-CM

## 2021-12-25 ENCOUNTER — Ambulatory Visit
Admission: RE | Admit: 2021-12-25 | Discharge: 2021-12-25 | Disposition: A | Discharge status: Home / Self Care | Payer: Federal, State, Local not specified - PPO | Source: Ambulatory Visit | Attending: Family Medicine | Admitting: Family Medicine

## 2021-12-25 DIAGNOSIS — Z1231 Encounter for screening mammogram for malignant neoplasm of breast: Secondary | ICD-10-CM | POA: Diagnosis not present

## 2022-01-01 ENCOUNTER — Other Ambulatory Visit (HOSPITAL_BASED_OUTPATIENT_CLINIC_OR_DEPARTMENT_OTHER): Payer: Self-pay

## 2022-02-10 DIAGNOSIS — F32A Depression, unspecified: Secondary | ICD-10-CM | POA: Diagnosis not present

## 2022-02-10 DIAGNOSIS — D649 Anemia, unspecified: Secondary | ICD-10-CM | POA: Diagnosis not present

## 2022-02-10 DIAGNOSIS — G8929 Other chronic pain: Secondary | ICD-10-CM | POA: Diagnosis not present

## 2022-02-10 DIAGNOSIS — M545 Low back pain, unspecified: Secondary | ICD-10-CM | POA: Diagnosis not present

## 2022-02-10 DIAGNOSIS — D509 Iron deficiency anemia, unspecified: Secondary | ICD-10-CM | POA: Diagnosis not present

## 2022-04-02 DIAGNOSIS — M5459 Other low back pain: Secondary | ICD-10-CM | POA: Diagnosis not present

## 2022-04-15 DIAGNOSIS — M5459 Other low back pain: Secondary | ICD-10-CM | POA: Diagnosis not present

## 2022-06-09 DIAGNOSIS — G8929 Other chronic pain: Secondary | ICD-10-CM | POA: Diagnosis not present

## 2022-06-09 DIAGNOSIS — F32A Depression, unspecified: Secondary | ICD-10-CM | POA: Diagnosis not present

## 2022-06-09 DIAGNOSIS — D509 Iron deficiency anemia, unspecified: Secondary | ICD-10-CM | POA: Diagnosis not present

## 2022-06-09 DIAGNOSIS — M545 Low back pain, unspecified: Secondary | ICD-10-CM | POA: Diagnosis not present

## 2022-06-29 DIAGNOSIS — F411 Generalized anxiety disorder: Secondary | ICD-10-CM | POA: Diagnosis not present

## 2022-07-29 DIAGNOSIS — F411 Generalized anxiety disorder: Secondary | ICD-10-CM | POA: Diagnosis not present

## 2022-09-02 DIAGNOSIS — M62830 Muscle spasm of back: Secondary | ICD-10-CM | POA: Diagnosis not present

## 2022-09-02 DIAGNOSIS — R000 Tachycardia, unspecified: Secondary | ICD-10-CM | POA: Diagnosis not present

## 2022-09-02 DIAGNOSIS — F5101 Primary insomnia: Secondary | ICD-10-CM | POA: Diagnosis not present

## 2022-09-02 DIAGNOSIS — F329 Major depressive disorder, single episode, unspecified: Secondary | ICD-10-CM | POA: Diagnosis not present

## 2022-09-02 DIAGNOSIS — F419 Anxiety disorder, unspecified: Secondary | ICD-10-CM | POA: Diagnosis not present

## 2022-09-02 DIAGNOSIS — I10 Essential (primary) hypertension: Secondary | ICD-10-CM | POA: Diagnosis not present

## 2022-09-02 DIAGNOSIS — F1220 Cannabis dependence, uncomplicated: Secondary | ICD-10-CM | POA: Diagnosis not present

## 2022-09-02 DIAGNOSIS — G4089 Other seizures: Secondary | ICD-10-CM | POA: Diagnosis not present

## 2022-09-02 DIAGNOSIS — F1020 Alcohol dependence, uncomplicated: Secondary | ICD-10-CM | POA: Diagnosis not present

## 2022-09-06 DIAGNOSIS — F1020 Alcohol dependence, uncomplicated: Secondary | ICD-10-CM | POA: Diagnosis not present

## 2022-09-06 DIAGNOSIS — R000 Tachycardia, unspecified: Secondary | ICD-10-CM | POA: Diagnosis not present

## 2022-09-06 DIAGNOSIS — G4089 Other seizures: Secondary | ICD-10-CM | POA: Diagnosis not present

## 2022-09-06 DIAGNOSIS — F1220 Cannabis dependence, uncomplicated: Secondary | ICD-10-CM | POA: Diagnosis not present

## 2022-09-06 DIAGNOSIS — I10 Essential (primary) hypertension: Secondary | ICD-10-CM | POA: Diagnosis not present

## 2022-09-06 DIAGNOSIS — F5101 Primary insomnia: Secondary | ICD-10-CM | POA: Diagnosis not present

## 2022-09-06 DIAGNOSIS — F329 Major depressive disorder, single episode, unspecified: Secondary | ICD-10-CM | POA: Diagnosis not present

## 2022-09-06 DIAGNOSIS — M62830 Muscle spasm of back: Secondary | ICD-10-CM | POA: Diagnosis not present

## 2022-09-06 DIAGNOSIS — F419 Anxiety disorder, unspecified: Secondary | ICD-10-CM | POA: Diagnosis not present

## 2022-09-07 DIAGNOSIS — R000 Tachycardia, unspecified: Secondary | ICD-10-CM | POA: Diagnosis not present

## 2022-09-07 DIAGNOSIS — I10 Essential (primary) hypertension: Secondary | ICD-10-CM | POA: Diagnosis not present

## 2022-09-07 DIAGNOSIS — F329 Major depressive disorder, single episode, unspecified: Secondary | ICD-10-CM | POA: Diagnosis not present

## 2022-09-07 DIAGNOSIS — M62830 Muscle spasm of back: Secondary | ICD-10-CM | POA: Diagnosis not present

## 2022-09-07 DIAGNOSIS — F5101 Primary insomnia: Secondary | ICD-10-CM | POA: Diagnosis not present

## 2022-09-07 DIAGNOSIS — G4089 Other seizures: Secondary | ICD-10-CM | POA: Diagnosis not present

## 2022-09-07 DIAGNOSIS — F419 Anxiety disorder, unspecified: Secondary | ICD-10-CM | POA: Diagnosis not present

## 2022-09-07 DIAGNOSIS — F1220 Cannabis dependence, uncomplicated: Secondary | ICD-10-CM | POA: Diagnosis not present

## 2022-09-07 DIAGNOSIS — F1020 Alcohol dependence, uncomplicated: Secondary | ICD-10-CM | POA: Diagnosis not present

## 2022-09-09 DIAGNOSIS — F419 Anxiety disorder, unspecified: Secondary | ICD-10-CM | POA: Diagnosis not present

## 2022-09-09 DIAGNOSIS — G4089 Other seizures: Secondary | ICD-10-CM | POA: Diagnosis not present

## 2022-09-09 DIAGNOSIS — F5101 Primary insomnia: Secondary | ICD-10-CM | POA: Diagnosis not present

## 2022-09-09 DIAGNOSIS — F329 Major depressive disorder, single episode, unspecified: Secondary | ICD-10-CM | POA: Diagnosis not present

## 2022-09-09 DIAGNOSIS — I10 Essential (primary) hypertension: Secondary | ICD-10-CM | POA: Diagnosis not present

## 2022-09-09 DIAGNOSIS — F1220 Cannabis dependence, uncomplicated: Secondary | ICD-10-CM | POA: Diagnosis not present

## 2022-09-09 DIAGNOSIS — R000 Tachycardia, unspecified: Secondary | ICD-10-CM | POA: Diagnosis not present

## 2022-09-09 DIAGNOSIS — M62830 Muscle spasm of back: Secondary | ICD-10-CM | POA: Diagnosis not present

## 2022-09-09 DIAGNOSIS — F1020 Alcohol dependence, uncomplicated: Secondary | ICD-10-CM | POA: Diagnosis not present

## 2022-09-13 DIAGNOSIS — F329 Major depressive disorder, single episode, unspecified: Secondary | ICD-10-CM | POA: Diagnosis not present

## 2022-09-13 DIAGNOSIS — F5101 Primary insomnia: Secondary | ICD-10-CM | POA: Diagnosis not present

## 2022-09-13 DIAGNOSIS — I10 Essential (primary) hypertension: Secondary | ICD-10-CM | POA: Diagnosis not present

## 2022-09-13 DIAGNOSIS — M62830 Muscle spasm of back: Secondary | ICD-10-CM | POA: Diagnosis not present

## 2022-09-13 DIAGNOSIS — R000 Tachycardia, unspecified: Secondary | ICD-10-CM | POA: Diagnosis not present

## 2022-09-13 DIAGNOSIS — F1220 Cannabis dependence, uncomplicated: Secondary | ICD-10-CM | POA: Diagnosis not present

## 2022-09-13 DIAGNOSIS — F1020 Alcohol dependence, uncomplicated: Secondary | ICD-10-CM | POA: Diagnosis not present

## 2022-09-13 DIAGNOSIS — F419 Anxiety disorder, unspecified: Secondary | ICD-10-CM | POA: Diagnosis not present

## 2022-09-13 DIAGNOSIS — G4089 Other seizures: Secondary | ICD-10-CM | POA: Diagnosis not present

## 2022-09-14 DIAGNOSIS — R000 Tachycardia, unspecified: Secondary | ICD-10-CM | POA: Diagnosis not present

## 2022-09-14 DIAGNOSIS — F1020 Alcohol dependence, uncomplicated: Secondary | ICD-10-CM | POA: Diagnosis not present

## 2022-09-14 DIAGNOSIS — F419 Anxiety disorder, unspecified: Secondary | ICD-10-CM | POA: Diagnosis not present

## 2022-09-14 DIAGNOSIS — M62830 Muscle spasm of back: Secondary | ICD-10-CM | POA: Diagnosis not present

## 2022-09-14 DIAGNOSIS — G4089 Other seizures: Secondary | ICD-10-CM | POA: Diagnosis not present

## 2022-09-14 DIAGNOSIS — F5101 Primary insomnia: Secondary | ICD-10-CM | POA: Diagnosis not present

## 2022-09-14 DIAGNOSIS — I10 Essential (primary) hypertension: Secondary | ICD-10-CM | POA: Diagnosis not present

## 2022-09-14 DIAGNOSIS — F1220 Cannabis dependence, uncomplicated: Secondary | ICD-10-CM | POA: Diagnosis not present

## 2022-09-14 DIAGNOSIS — F329 Major depressive disorder, single episode, unspecified: Secondary | ICD-10-CM | POA: Diagnosis not present

## 2022-09-20 DIAGNOSIS — F1220 Cannabis dependence, uncomplicated: Secondary | ICD-10-CM | POA: Diagnosis not present

## 2022-09-20 DIAGNOSIS — F1020 Alcohol dependence, uncomplicated: Secondary | ICD-10-CM | POA: Diagnosis not present

## 2022-09-20 DIAGNOSIS — R000 Tachycardia, unspecified: Secondary | ICD-10-CM | POA: Diagnosis not present

## 2022-09-20 DIAGNOSIS — M62830 Muscle spasm of back: Secondary | ICD-10-CM | POA: Diagnosis not present

## 2022-09-20 DIAGNOSIS — F5101 Primary insomnia: Secondary | ICD-10-CM | POA: Diagnosis not present

## 2022-09-20 DIAGNOSIS — F329 Major depressive disorder, single episode, unspecified: Secondary | ICD-10-CM | POA: Diagnosis not present

## 2022-09-20 DIAGNOSIS — G4089 Other seizures: Secondary | ICD-10-CM | POA: Diagnosis not present

## 2022-09-20 DIAGNOSIS — F419 Anxiety disorder, unspecified: Secondary | ICD-10-CM | POA: Diagnosis not present

## 2022-09-20 DIAGNOSIS — I10 Essential (primary) hypertension: Secondary | ICD-10-CM | POA: Diagnosis not present

## 2022-09-21 DIAGNOSIS — F329 Major depressive disorder, single episode, unspecified: Secondary | ICD-10-CM | POA: Diagnosis not present

## 2022-09-21 DIAGNOSIS — G4089 Other seizures: Secondary | ICD-10-CM | POA: Diagnosis not present

## 2022-09-21 DIAGNOSIS — F419 Anxiety disorder, unspecified: Secondary | ICD-10-CM | POA: Diagnosis not present

## 2022-09-21 DIAGNOSIS — I10 Essential (primary) hypertension: Secondary | ICD-10-CM | POA: Diagnosis not present

## 2022-09-21 DIAGNOSIS — F1220 Cannabis dependence, uncomplicated: Secondary | ICD-10-CM | POA: Diagnosis not present

## 2022-09-21 DIAGNOSIS — F1020 Alcohol dependence, uncomplicated: Secondary | ICD-10-CM | POA: Diagnosis not present

## 2022-09-21 DIAGNOSIS — R000 Tachycardia, unspecified: Secondary | ICD-10-CM | POA: Diagnosis not present

## 2022-09-21 DIAGNOSIS — F5101 Primary insomnia: Secondary | ICD-10-CM | POA: Diagnosis not present

## 2022-09-21 DIAGNOSIS — M62830 Muscle spasm of back: Secondary | ICD-10-CM | POA: Diagnosis not present

## 2022-09-27 DIAGNOSIS — F5101 Primary insomnia: Secondary | ICD-10-CM | POA: Diagnosis not present

## 2022-09-27 DIAGNOSIS — M62830 Muscle spasm of back: Secondary | ICD-10-CM | POA: Diagnosis not present

## 2022-09-27 DIAGNOSIS — R000 Tachycardia, unspecified: Secondary | ICD-10-CM | POA: Diagnosis not present

## 2022-09-27 DIAGNOSIS — F419 Anxiety disorder, unspecified: Secondary | ICD-10-CM | POA: Diagnosis not present

## 2022-09-27 DIAGNOSIS — F1220 Cannabis dependence, uncomplicated: Secondary | ICD-10-CM | POA: Diagnosis not present

## 2022-09-27 DIAGNOSIS — G4089 Other seizures: Secondary | ICD-10-CM | POA: Diagnosis not present

## 2022-09-27 DIAGNOSIS — I10 Essential (primary) hypertension: Secondary | ICD-10-CM | POA: Diagnosis not present

## 2022-09-27 DIAGNOSIS — F329 Major depressive disorder, single episode, unspecified: Secondary | ICD-10-CM | POA: Diagnosis not present

## 2022-09-27 DIAGNOSIS — F1020 Alcohol dependence, uncomplicated: Secondary | ICD-10-CM | POA: Diagnosis not present

## 2022-09-28 DIAGNOSIS — F419 Anxiety disorder, unspecified: Secondary | ICD-10-CM | POA: Diagnosis not present

## 2022-09-28 DIAGNOSIS — F1020 Alcohol dependence, uncomplicated: Secondary | ICD-10-CM | POA: Diagnosis not present

## 2022-09-28 DIAGNOSIS — M62830 Muscle spasm of back: Secondary | ICD-10-CM | POA: Diagnosis not present

## 2022-09-28 DIAGNOSIS — F5101 Primary insomnia: Secondary | ICD-10-CM | POA: Diagnosis not present

## 2022-09-28 DIAGNOSIS — F1220 Cannabis dependence, uncomplicated: Secondary | ICD-10-CM | POA: Diagnosis not present

## 2022-09-28 DIAGNOSIS — R000 Tachycardia, unspecified: Secondary | ICD-10-CM | POA: Diagnosis not present

## 2022-09-28 DIAGNOSIS — F329 Major depressive disorder, single episode, unspecified: Secondary | ICD-10-CM | POA: Diagnosis not present

## 2022-09-28 DIAGNOSIS — I10 Essential (primary) hypertension: Secondary | ICD-10-CM | POA: Diagnosis not present

## 2022-09-28 DIAGNOSIS — G4089 Other seizures: Secondary | ICD-10-CM | POA: Diagnosis not present

## 2022-09-29 DIAGNOSIS — F411 Generalized anxiety disorder: Secondary | ICD-10-CM | POA: Diagnosis not present

## 2022-09-30 DIAGNOSIS — F1220 Cannabis dependence, uncomplicated: Secondary | ICD-10-CM | POA: Diagnosis not present

## 2022-09-30 DIAGNOSIS — F419 Anxiety disorder, unspecified: Secondary | ICD-10-CM | POA: Diagnosis not present

## 2022-09-30 DIAGNOSIS — F329 Major depressive disorder, single episode, unspecified: Secondary | ICD-10-CM | POA: Diagnosis not present

## 2022-09-30 DIAGNOSIS — I10 Essential (primary) hypertension: Secondary | ICD-10-CM | POA: Diagnosis not present

## 2022-09-30 DIAGNOSIS — F5101 Primary insomnia: Secondary | ICD-10-CM | POA: Diagnosis not present

## 2022-09-30 DIAGNOSIS — R000 Tachycardia, unspecified: Secondary | ICD-10-CM | POA: Diagnosis not present

## 2022-09-30 DIAGNOSIS — G4089 Other seizures: Secondary | ICD-10-CM | POA: Diagnosis not present

## 2022-09-30 DIAGNOSIS — M62830 Muscle spasm of back: Secondary | ICD-10-CM | POA: Diagnosis not present

## 2022-09-30 DIAGNOSIS — F1020 Alcohol dependence, uncomplicated: Secondary | ICD-10-CM | POA: Diagnosis not present

## 2022-10-05 DIAGNOSIS — F1220 Cannabis dependence, uncomplicated: Secondary | ICD-10-CM | POA: Diagnosis not present

## 2022-10-05 DIAGNOSIS — F5101 Primary insomnia: Secondary | ICD-10-CM | POA: Diagnosis not present

## 2022-10-05 DIAGNOSIS — M62830 Muscle spasm of back: Secondary | ICD-10-CM | POA: Diagnosis not present

## 2022-10-05 DIAGNOSIS — F419 Anxiety disorder, unspecified: Secondary | ICD-10-CM | POA: Diagnosis not present

## 2022-10-05 DIAGNOSIS — F1020 Alcohol dependence, uncomplicated: Secondary | ICD-10-CM | POA: Diagnosis not present

## 2022-10-05 DIAGNOSIS — G4089 Other seizures: Secondary | ICD-10-CM | POA: Diagnosis not present

## 2022-10-05 DIAGNOSIS — R000 Tachycardia, unspecified: Secondary | ICD-10-CM | POA: Diagnosis not present

## 2022-10-05 DIAGNOSIS — F329 Major depressive disorder, single episode, unspecified: Secondary | ICD-10-CM | POA: Diagnosis not present

## 2022-10-05 DIAGNOSIS — I10 Essential (primary) hypertension: Secondary | ICD-10-CM | POA: Diagnosis not present

## 2022-10-07 DIAGNOSIS — R000 Tachycardia, unspecified: Secondary | ICD-10-CM | POA: Diagnosis not present

## 2022-10-07 DIAGNOSIS — G4089 Other seizures: Secondary | ICD-10-CM | POA: Diagnosis not present

## 2022-10-07 DIAGNOSIS — M62830 Muscle spasm of back: Secondary | ICD-10-CM | POA: Diagnosis not present

## 2022-10-07 DIAGNOSIS — F419 Anxiety disorder, unspecified: Secondary | ICD-10-CM | POA: Diagnosis not present

## 2022-10-07 DIAGNOSIS — F329 Major depressive disorder, single episode, unspecified: Secondary | ICD-10-CM | POA: Diagnosis not present

## 2022-10-07 DIAGNOSIS — F1020 Alcohol dependence, uncomplicated: Secondary | ICD-10-CM | POA: Diagnosis not present

## 2022-10-07 DIAGNOSIS — I10 Essential (primary) hypertension: Secondary | ICD-10-CM | POA: Diagnosis not present

## 2022-10-07 DIAGNOSIS — F5101 Primary insomnia: Secondary | ICD-10-CM | POA: Diagnosis not present

## 2022-10-07 DIAGNOSIS — F1220 Cannabis dependence, uncomplicated: Secondary | ICD-10-CM | POA: Diagnosis not present

## 2022-10-11 DIAGNOSIS — M62830 Muscle spasm of back: Secondary | ICD-10-CM | POA: Diagnosis not present

## 2022-10-11 DIAGNOSIS — F5101 Primary insomnia: Secondary | ICD-10-CM | POA: Diagnosis not present

## 2022-10-11 DIAGNOSIS — R000 Tachycardia, unspecified: Secondary | ICD-10-CM | POA: Diagnosis not present

## 2022-10-11 DIAGNOSIS — G4089 Other seizures: Secondary | ICD-10-CM | POA: Diagnosis not present

## 2022-10-11 DIAGNOSIS — F1020 Alcohol dependence, uncomplicated: Secondary | ICD-10-CM | POA: Diagnosis not present

## 2022-10-11 DIAGNOSIS — F329 Major depressive disorder, single episode, unspecified: Secondary | ICD-10-CM | POA: Diagnosis not present

## 2022-10-11 DIAGNOSIS — I10 Essential (primary) hypertension: Secondary | ICD-10-CM | POA: Diagnosis not present

## 2022-10-11 DIAGNOSIS — F419 Anxiety disorder, unspecified: Secondary | ICD-10-CM | POA: Diagnosis not present

## 2022-10-11 DIAGNOSIS — F1220 Cannabis dependence, uncomplicated: Secondary | ICD-10-CM | POA: Diagnosis not present

## 2022-10-12 DIAGNOSIS — G4089 Other seizures: Secondary | ICD-10-CM | POA: Diagnosis not present

## 2022-10-12 DIAGNOSIS — F5101 Primary insomnia: Secondary | ICD-10-CM | POA: Diagnosis not present

## 2022-10-12 DIAGNOSIS — F329 Major depressive disorder, single episode, unspecified: Secondary | ICD-10-CM | POA: Diagnosis not present

## 2022-10-12 DIAGNOSIS — I10 Essential (primary) hypertension: Secondary | ICD-10-CM | POA: Diagnosis not present

## 2022-10-12 DIAGNOSIS — M62830 Muscle spasm of back: Secondary | ICD-10-CM | POA: Diagnosis not present

## 2022-10-12 DIAGNOSIS — F1020 Alcohol dependence, uncomplicated: Secondary | ICD-10-CM | POA: Diagnosis not present

## 2022-10-12 DIAGNOSIS — F1220 Cannabis dependence, uncomplicated: Secondary | ICD-10-CM | POA: Diagnosis not present

## 2022-10-12 DIAGNOSIS — R000 Tachycardia, unspecified: Secondary | ICD-10-CM | POA: Diagnosis not present

## 2022-10-12 DIAGNOSIS — F419 Anxiety disorder, unspecified: Secondary | ICD-10-CM | POA: Diagnosis not present

## 2022-10-14 DIAGNOSIS — M62830 Muscle spasm of back: Secondary | ICD-10-CM | POA: Diagnosis not present

## 2022-10-14 DIAGNOSIS — F329 Major depressive disorder, single episode, unspecified: Secondary | ICD-10-CM | POA: Diagnosis not present

## 2022-10-14 DIAGNOSIS — R000 Tachycardia, unspecified: Secondary | ICD-10-CM | POA: Diagnosis not present

## 2022-10-14 DIAGNOSIS — F419 Anxiety disorder, unspecified: Secondary | ICD-10-CM | POA: Diagnosis not present

## 2022-10-14 DIAGNOSIS — I10 Essential (primary) hypertension: Secondary | ICD-10-CM | POA: Diagnosis not present

## 2022-10-14 DIAGNOSIS — F1020 Alcohol dependence, uncomplicated: Secondary | ICD-10-CM | POA: Diagnosis not present

## 2022-10-14 DIAGNOSIS — F1220 Cannabis dependence, uncomplicated: Secondary | ICD-10-CM | POA: Diagnosis not present

## 2022-10-14 DIAGNOSIS — G4089 Other seizures: Secondary | ICD-10-CM | POA: Diagnosis not present

## 2022-10-14 DIAGNOSIS — F5101 Primary insomnia: Secondary | ICD-10-CM | POA: Diagnosis not present

## 2022-10-18 DIAGNOSIS — F5101 Primary insomnia: Secondary | ICD-10-CM | POA: Diagnosis not present

## 2022-10-18 DIAGNOSIS — M62830 Muscle spasm of back: Secondary | ICD-10-CM | POA: Diagnosis not present

## 2022-10-18 DIAGNOSIS — F1220 Cannabis dependence, uncomplicated: Secondary | ICD-10-CM | POA: Diagnosis not present

## 2022-10-18 DIAGNOSIS — R000 Tachycardia, unspecified: Secondary | ICD-10-CM | POA: Diagnosis not present

## 2022-10-18 DIAGNOSIS — I10 Essential (primary) hypertension: Secondary | ICD-10-CM | POA: Diagnosis not present

## 2022-10-18 DIAGNOSIS — F1020 Alcohol dependence, uncomplicated: Secondary | ICD-10-CM | POA: Diagnosis not present

## 2022-10-18 DIAGNOSIS — F329 Major depressive disorder, single episode, unspecified: Secondary | ICD-10-CM | POA: Diagnosis not present

## 2022-10-18 DIAGNOSIS — F419 Anxiety disorder, unspecified: Secondary | ICD-10-CM | POA: Diagnosis not present

## 2022-10-18 DIAGNOSIS — G4089 Other seizures: Secondary | ICD-10-CM | POA: Diagnosis not present

## 2022-10-21 DIAGNOSIS — F419 Anxiety disorder, unspecified: Secondary | ICD-10-CM | POA: Diagnosis not present

## 2022-10-21 DIAGNOSIS — G4089 Other seizures: Secondary | ICD-10-CM | POA: Diagnosis not present

## 2022-10-21 DIAGNOSIS — I10 Essential (primary) hypertension: Secondary | ICD-10-CM | POA: Diagnosis not present

## 2022-10-21 DIAGNOSIS — F5101 Primary insomnia: Secondary | ICD-10-CM | POA: Diagnosis not present

## 2022-10-21 DIAGNOSIS — M62830 Muscle spasm of back: Secondary | ICD-10-CM | POA: Diagnosis not present

## 2022-10-21 DIAGNOSIS — F1220 Cannabis dependence, uncomplicated: Secondary | ICD-10-CM | POA: Diagnosis not present

## 2022-10-21 DIAGNOSIS — F329 Major depressive disorder, single episode, unspecified: Secondary | ICD-10-CM | POA: Diagnosis not present

## 2022-10-21 DIAGNOSIS — R000 Tachycardia, unspecified: Secondary | ICD-10-CM | POA: Diagnosis not present

## 2022-10-21 DIAGNOSIS — F1020 Alcohol dependence, uncomplicated: Secondary | ICD-10-CM | POA: Diagnosis not present

## 2022-10-25 DIAGNOSIS — F1020 Alcohol dependence, uncomplicated: Secondary | ICD-10-CM | POA: Diagnosis not present

## 2022-10-25 DIAGNOSIS — F1220 Cannabis dependence, uncomplicated: Secondary | ICD-10-CM | POA: Diagnosis not present

## 2022-10-25 DIAGNOSIS — G4089 Other seizures: Secondary | ICD-10-CM | POA: Diagnosis not present

## 2022-10-25 DIAGNOSIS — R000 Tachycardia, unspecified: Secondary | ICD-10-CM | POA: Diagnosis not present

## 2022-10-25 DIAGNOSIS — F5101 Primary insomnia: Secondary | ICD-10-CM | POA: Diagnosis not present

## 2022-10-25 DIAGNOSIS — I10 Essential (primary) hypertension: Secondary | ICD-10-CM | POA: Diagnosis not present

## 2022-10-25 DIAGNOSIS — M62830 Muscle spasm of back: Secondary | ICD-10-CM | POA: Diagnosis not present

## 2022-10-25 DIAGNOSIS — F419 Anxiety disorder, unspecified: Secondary | ICD-10-CM | POA: Diagnosis not present

## 2022-10-25 DIAGNOSIS — F329 Major depressive disorder, single episode, unspecified: Secondary | ICD-10-CM | POA: Diagnosis not present

## 2022-10-26 DIAGNOSIS — F419 Anxiety disorder, unspecified: Secondary | ICD-10-CM | POA: Diagnosis not present

## 2022-10-26 DIAGNOSIS — M62830 Muscle spasm of back: Secondary | ICD-10-CM | POA: Diagnosis not present

## 2022-10-26 DIAGNOSIS — F5101 Primary insomnia: Secondary | ICD-10-CM | POA: Diagnosis not present

## 2022-10-26 DIAGNOSIS — R000 Tachycardia, unspecified: Secondary | ICD-10-CM | POA: Diagnosis not present

## 2022-10-26 DIAGNOSIS — G4089 Other seizures: Secondary | ICD-10-CM | POA: Diagnosis not present

## 2022-10-26 DIAGNOSIS — F329 Major depressive disorder, single episode, unspecified: Secondary | ICD-10-CM | POA: Diagnosis not present

## 2022-10-26 DIAGNOSIS — F1020 Alcohol dependence, uncomplicated: Secondary | ICD-10-CM | POA: Diagnosis not present

## 2022-10-26 DIAGNOSIS — F1220 Cannabis dependence, uncomplicated: Secondary | ICD-10-CM | POA: Diagnosis not present

## 2022-10-26 DIAGNOSIS — I10 Essential (primary) hypertension: Secondary | ICD-10-CM | POA: Diagnosis not present

## 2023-07-12 ENCOUNTER — Other Ambulatory Visit: Payer: Self-pay | Admitting: Family Medicine

## 2023-07-12 DIAGNOSIS — Z1231 Encounter for screening mammogram for malignant neoplasm of breast: Secondary | ICD-10-CM

## 2023-08-08 ENCOUNTER — Ambulatory Visit
Admission: RE | Admit: 2023-08-08 | Discharge: 2023-08-08 | Disposition: A | Discharge status: Home / Self Care | Payer: Federal, State, Local not specified - PPO | Source: Ambulatory Visit | Attending: Family Medicine | Admitting: Family Medicine

## 2023-08-08 DIAGNOSIS — Z1231 Encounter for screening mammogram for malignant neoplasm of breast: Secondary | ICD-10-CM | POA: Diagnosis not present

## 2023-08-10 ENCOUNTER — Encounter (HOSPITAL_BASED_OUTPATIENT_CLINIC_OR_DEPARTMENT_OTHER): Payer: Self-pay | Admitting: Emergency Medicine

## 2023-08-10 ENCOUNTER — Other Ambulatory Visit: Payer: Self-pay

## 2023-08-10 ENCOUNTER — Emergency Department (HOSPITAL_BASED_OUTPATIENT_CLINIC_OR_DEPARTMENT_OTHER)
Admission: EM | Admit: 2023-08-10 | Discharge: 2023-08-10 | Disposition: A | Discharge status: Home / Self Care | Payer: Federal, State, Local not specified - PPO | Attending: Emergency Medicine | Admitting: Emergency Medicine

## 2023-08-10 ENCOUNTER — Other Ambulatory Visit (HOSPITAL_BASED_OUTPATIENT_CLINIC_OR_DEPARTMENT_OTHER): Payer: Self-pay

## 2023-08-10 DIAGNOSIS — M545 Low back pain, unspecified: Secondary | ICD-10-CM | POA: Diagnosis not present

## 2023-08-10 DIAGNOSIS — J45909 Unspecified asthma, uncomplicated: Secondary | ICD-10-CM | POA: Diagnosis not present

## 2023-08-10 DIAGNOSIS — Z7984 Long term (current) use of oral hypoglycemic drugs: Secondary | ICD-10-CM | POA: Diagnosis not present

## 2023-08-10 DIAGNOSIS — E119 Type 2 diabetes mellitus without complications: Secondary | ICD-10-CM | POA: Diagnosis not present

## 2023-08-10 LAB — URINALYSIS, ROUTINE W REFLEX MICROSCOPIC
Bilirubin Urine: NEGATIVE
Glucose, UA: NEGATIVE mg/dL
Hgb urine dipstick: NEGATIVE
Ketones, ur: NEGATIVE mg/dL
Leukocytes,Ua: NEGATIVE
Nitrite: NEGATIVE
Protein, ur: NEGATIVE mg/dL
Specific Gravity, Urine: 1.02 (ref 1.005–1.030)
pH: 5.5 (ref 5.0–8.0)

## 2023-08-10 LAB — PREGNANCY, URINE: Preg Test, Ur: NEGATIVE

## 2023-08-10 MED ORDER — METHOCARBAMOL 500 MG PO TABS
500.0000 mg | ORAL_TABLET | Freq: Two times a day (BID) | ORAL | 0 refills | Status: AC
  Filled 2023-08-10: qty 20, 10d supply, fill #0

## 2023-08-10 MED ORDER — LIDOCAINE 5 % EX PTCH
1.0000 | MEDICATED_PATCH | CUTANEOUS | 0 refills | Status: AC
  Filled 2023-08-10: qty 30, 30d supply, fill #0

## 2023-08-10 MED ORDER — ACETAMINOPHEN 500 MG PO TABS
1000.0000 mg | ORAL_TABLET | Freq: Once | ORAL | Status: AC
  Administered 2023-08-10: 1000 mg via ORAL
  Filled 2023-08-10: qty 2

## 2023-08-10 MED ORDER — KETOROLAC TROMETHAMINE 30 MG/ML IJ SOLN
30.0000 mg | Freq: Once | INTRAMUSCULAR | Status: AC
  Administered 2023-08-10: 30 mg via INTRAMUSCULAR
  Filled 2023-08-10: qty 1

## 2023-08-10 MED ORDER — METHOCARBAMOL 500 MG PO TABS
500.0000 mg | ORAL_TABLET | Freq: Once | ORAL | Status: AC
  Administered 2023-08-10: 500 mg via ORAL
  Filled 2023-08-10: qty 1

## 2023-08-10 MED ORDER — LIDOCAINE 5 % EX PTCH
1.0000 | MEDICATED_PATCH | CUTANEOUS | Status: DC
  Administered 2023-08-10: 1 via TRANSDERMAL
  Filled 2023-08-10: qty 1

## 2023-08-10 NOTE — ED Triage Notes (Signed)
Right lower back pain x 3 days , worse this morning . No urinary symptoms. Reports does a lot of lifting .

## 2023-08-10 NOTE — Discharge Instructions (Addendum)
Thank you for coming to [facility] Emergency Department. You were seen for lower back pain. We did an exam, labs which showed likely a low back strain or muscle spasm. You can alternate taking Tylenol and ibuprofen as needed for pain. You can take 650mg  tylenol (acetaminophen) every 4-6 hours, and 600 mg ibuprofen 3 times a day. We have prescribed lidocaine patch which you can use once per day. We have also prescribed robaxin which you can take twice per day for muscle spasm. Stretching, rest, and massage can also help.   Please follow up with your primary care provider within 1 week if your symptoms do not improve.   Return to the ED if you develop any of the following: - Fever (100.4 F or 38 C) or chills at home that do not respond to over the counter medications - Weakness, numbness, or tingling in your extremities - Difficulty emptying bladder / urinary incontinence - Fecal incontinence - Uncontrolled nausea/vomiting with inability to keep down liquids - Feeling as though you are going to pass out or passing out - Anything else that concerns you

## 2023-08-10 NOTE — ED Provider Notes (Signed)
[facility] Provider Note   CSN: [id number] Arrival date & time: 08/10/23  0102     History  Chief Complaint  Patient presents with   Back Pain    JISSELL TKAC is a 47 y.o. female with PMH as listed below who presents with right lower back pain x 3 days , worse this morning. Worse with movement. Does a lot of repetetive twisting movements/lifting at work which she thinks has exacerbated/caused this pain. No midline pain, trauma/falls, fevers/chills, urinary symptoms, vaginal symptoms, abdominal pain, h/o malignancy, h/o IVDU, LE weakness/numbness/tingling, bowel/bladder incontinence/retention, saddle anesthesia. Tried tylenol at home which didn't help.    Past Medical History:  Diagnosis Date   Asthma    Diabetes mellitus without complication (HCC)    Fibroid    Gastric ulcer    Heart murmur    Migraines    Tonsillitis        Home Medications Prior to Admission medications   Medication Sig Start Date End Date Taking? Authorizing Provider  lidocaine (LIDODERM) 5 % Place 1 patch onto the skin daily. Remove & Discard patch within 12 hours or as directed by MD 08/10/23  Yes Loetta Rough, MD  methocarbamol (ROBAXIN) 500 MG tablet Take 1 tablet (500 mg total) by mouth 2 (two) times daily. 08/10/23  Yes Loetta Rough, MD  albuterol (PROVENTIL HFA;VENTOLIN HFA) 108 (90 BASE) MCG/ACT inhaler Inhale into the lungs every 6 (six) hours as needed for wheezing or shortness of breath.    [provider]  amoxicillin-clavulanate (AUGMENTIN) 875-125 MG tablet Take 1 tablet by mouth every 12 hours for 10 days 02/04/21     Cholecalciferol (VITAMIN D PO) Take 1 tablet by mouth daily.    [provider]  Cod Liver Oil (COD LIVER PO) Take 1 tablet by mouth daily.    [provider]  gabapentin (NEURONTIN) 100 MG capsule Take 1 capsule (100 mg total) by mouth 3 (three) times daily for 10 days. 12/07/17 12/17/17  Shaune Pollack, MD  metFORMIN (GLUCOPHAGE) 500 MG tablet Take by mouth daily with breakfast.    [provider]  metoprolol succinate (TOPROL XL) 25 MG 24 hr tablet Take 1 tablet (25 mg total) by mouth daily. 02/25/16   Lewayne Bunting, MD  metoprolol succinate (TOPROL-XL) 25 MG 24 hr tablet take 1 tablet by mouth once daily 03/18/21     metoprolol succinate (TOPROL-XL) 25 MG 24 hr tablet TAKE 1 TABLET BY MOUTH ONCE DAILY 10/31/20 10/31/21  Wallie Renshaw, FNP  ranitidine (ZANTAC) 150 MG tablet Take 150 mg by mouth 2 (two) times daily.    [provider]  traMADol (ULTRAM) 50 MG tablet Take 1 tablet (50 mg total) by mouth every 6 (six) hours as needed for severe pain. 12/07/17   Shaune Pollack, MD      Allergies    Food    Review of Systems   Review of Systems A 10 point review of systems was performed and is negative unless otherwise reported in HPI.  Physical Exam Updated Vital Signs BP 124/70   Pulse 63   Temp 98.5 F (36.9 C)   Resp 16   Wt 112.5 kg   LMP 07/27/2023 (Approximate)   SpO2 100%   BMI 38.84 kg/m  Physical Exam General: Normal appearing female, lying in bed.  HEENT: Sclera anicteric, MMM, trachea midline.  Cardiology: RRR, no murmurs/rubs/gallops.  Resp: Normal respiratory rate and effort. CTAB, no wheezes,  rhonchi, crackles.  Abd: Soft, non-tender, non-distended. No rebound tenderness or guarding.  GU: Deferred. MSK: No peripheral edema or signs of trauma. Extremities without deformity or TTP. No cyanosis or clubbing. Skin: warm, dry. No rashes or lesions. Back: No CVA tenderness. R sided lower lumbar TTP. No midline T or L spine TTP. Pain with rotational movements of trunk.  Neuro: A&Ox4, CNs II-XII grossly intact. MAEs. Sensation grossly intact. Normal gait.   Psych: Normal mood and affect.   ED Results / Procedures / Treatments   Labs (all labs ordered are listed, but only abnormal results are displayed) Labs Reviewed  PREGNANCY, URINE   URINALYSIS, ROUTINE W REFLEX MICROSCOPIC    EKG None  Radiology No results found.  Procedures Procedures    Medications Ordered in ED Medications  lidocaine (LIDODERM) 5 % 1 patch (1 patch Transdermal Patch Applied 08/10/23 1131)  ketorolac (TORADOL) 30 MG/ML injection 30 mg (30 mg Intramuscular Given 08/10/23 1131)  acetaminophen (TYLENOL) tablet 1,000 mg (1,000 mg Oral Given 08/10/23 1131)  methocarbamol (ROBAXIN) tablet 500 mg (500 mg Oral Given 08/10/23 1131)    ED Course/ Medical Decision Making/ A&P                          Medical Decision Making Amount and/or Complexity of Data Reviewed Labs: ordered.  Risk OTC drugs. Prescription drug management.    This patient presents to the ED for concern of R LBP, this involves an extensive number of treatment options, and is a complaint that carries with it a high risk of complications and morbidity.  MDM:    DDX for low back pain includes but is not limited to:   Consider MSK pain as presenting etiology and back strain or sciatica. Less likely sciatica as straight leg raise test was negative. No back pain red flags on history or physical. Presentation not consistent with malignancy (lack of history of malignancy, lack of B symptoms), fracture (no trauma, no bony tenderness to palpation), cauda equina (no bowel or urinary incontinence/retention, no saddle anesthesia, no distal weakness), osteomyelitis or epidural abscess (no IVDU, vertebral tenderness), renal colic, pyelonephritis (afebrile, no CVAT, no urinary symptoms). Given the clinical picture, no indication for imaging at this time.  Patient is given tylenol, toradol, methocarbamol, and lidocaine patch for her pain. Her pain significantly improves. DC'd with instructions for massage, stretching, heat packs, and methocarbamol and lidocaine patch rx's. Instructed to f/u with PCP.    Clinical Course as of 08/10/23 1415  Wed Aug 10, 2023  1042 Neg UA, Upreg [HN]     Clinical Course User Index [HN] Loetta Rough, MD    Labs: I Ordered, and personally interpreted labs.  The pertinent results include: those listed above  Additional history obtained from chart review  Reevaluation: After the interventions noted above, I reevaluated the patient and found that they have :improved  Social Determinants of Health: Lives independently  Disposition:  DC w/ discharge instructions/return precautions. All questions answered to patient's satisfaction.    Co morbidities that complicate the patient evaluation  Past Medical History:  Diagnosis Date   Asthma    Diabetes mellitus without complication (HCC)    Fibroid    Gastric ulcer    Heart murmur    Migraines    Tonsillitis      Medicines Meds ordered this encounter  Medications   lidocaine (LIDODERM) 5 % 1 patch   ketorolac (TORADOL) 30 MG/ML injection 30 mg  acetaminophen (TYLENOL) tablet 1,000 mg   methocarbamol (ROBAXIN) tablet 500 mg   methocarbamol (ROBAXIN) 500 MG tablet    Sig: Take 1 tablet (500 mg total) by mouth 2 (two) times daily.    Dispense:  20 tablet    Refill:  0   lidocaine (LIDODERM) 5 %    Sig: Place 1 patch onto the skin daily. Remove & Discard patch within 12 hours or as directed by MD    Dispense:  30 patch    Refill:  0    I have reviewed the patients home medicines and have made adjustments as needed  Problem List / ED Course: Problem List Items Addressed This Visit   None Visit Diagnoses     Acute right-sided low back pain without sciatica    -  Primary   Relevant Medications   ketorolac (TORADOL) 30 MG/ML injection 30 mg (Completed)   acetaminophen (TYLENOL) tablet 1,000 mg (Completed)   methocarbamol (ROBAXIN) tablet 500 mg (Completed)   methocarbamol (ROBAXIN) 500 MG tablet                   This note was created using dictation software, which may contain spelling or grammatical errors.    Loetta Rough, MD 08/16/23 1020

## 2023-10-07 IMAGING — MG MM DIGITAL SCREENING BILAT W/ TOMO AND CAD
8 series · 8 of 24 positions shown · non-contrast
Comparison: Previous exam(s).

CLINICAL DATA: Screening.

EXAM:
DIGITAL SCREENING BILATERAL MAMMOGRAM WITH TOMOSYNTHESIS AND CAD
TECHNIQUE: Bilateral screening digital craniocaudal and mediolateral oblique
mammograms were obtained. Bilateral screening digital breast
tomosynthesis was performed. The images were evaluated with
computer-aided detection.

[L CC synth-2D]
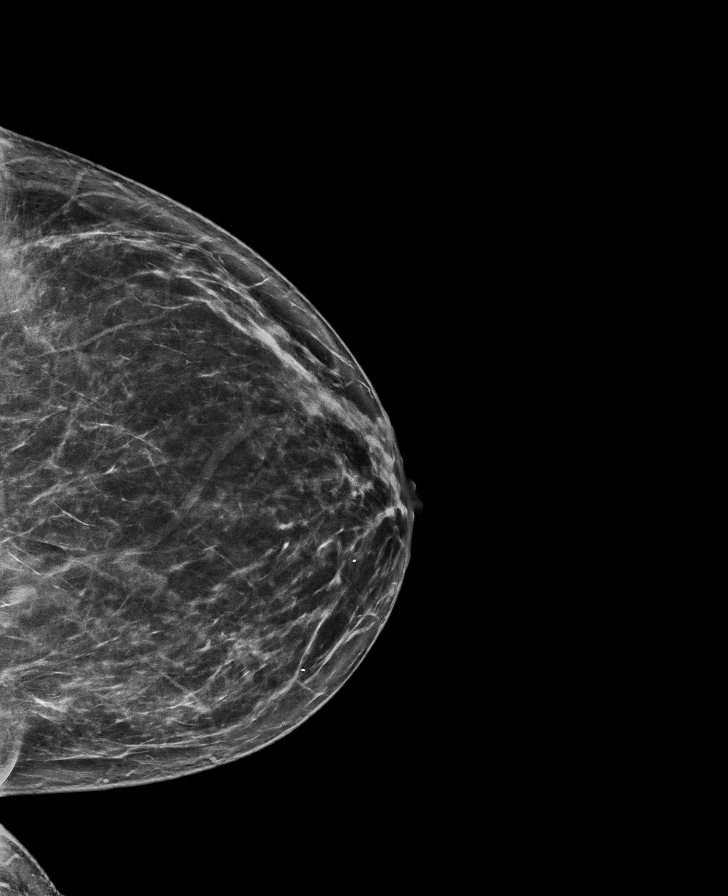

[L MLO synth-2D]
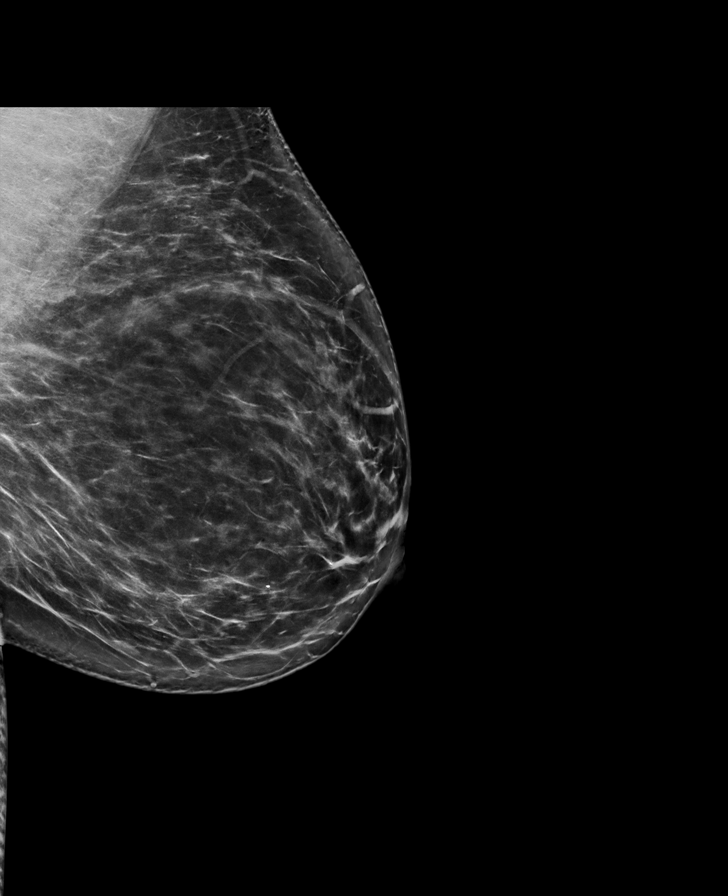

[R CC synth-2D]
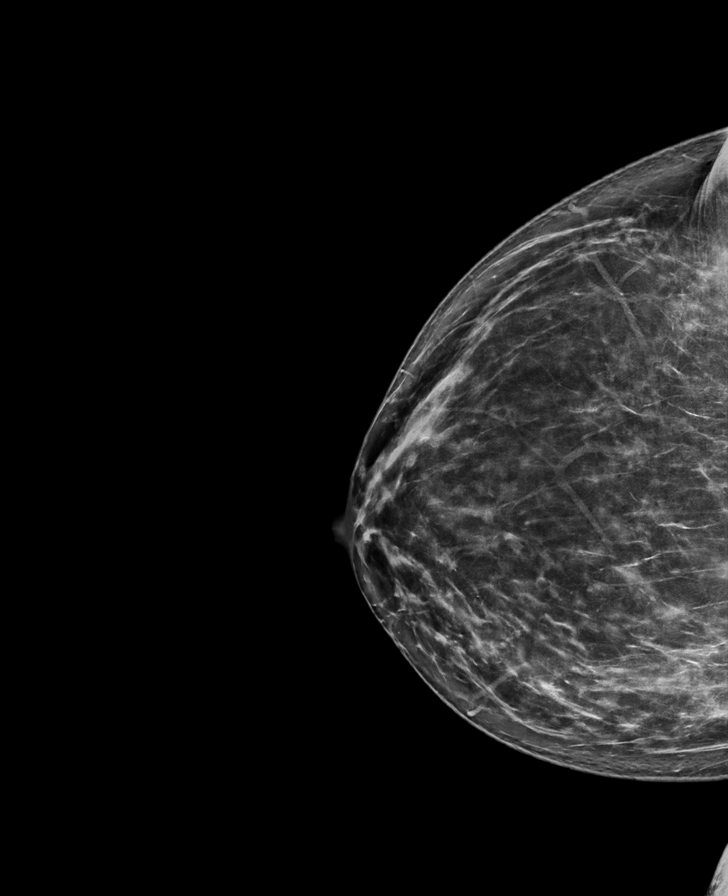

[R MLO synth-2D]
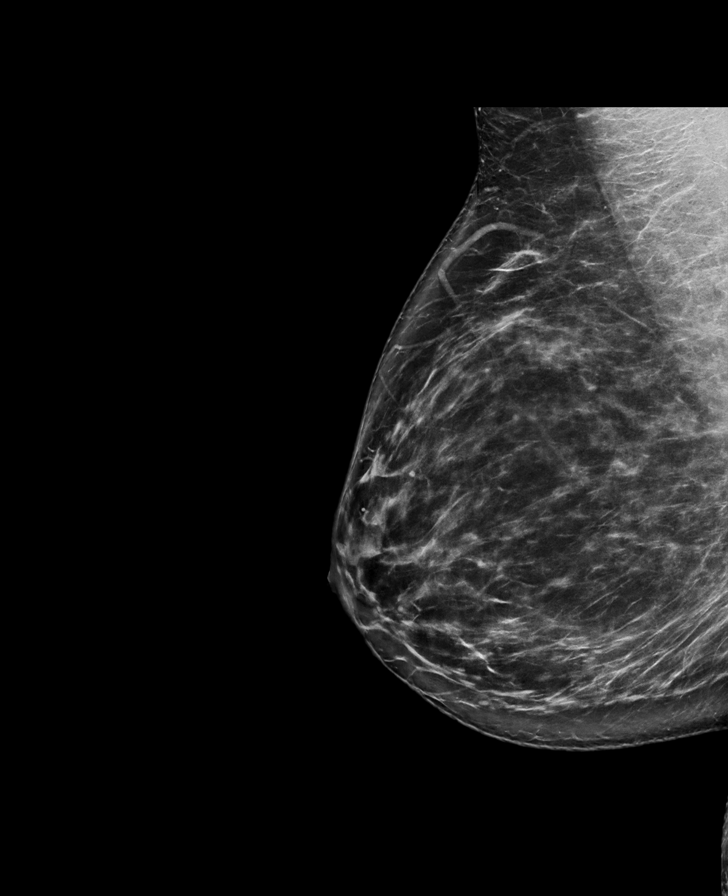

[L MLO tomo · tomo slice 46/91.0]
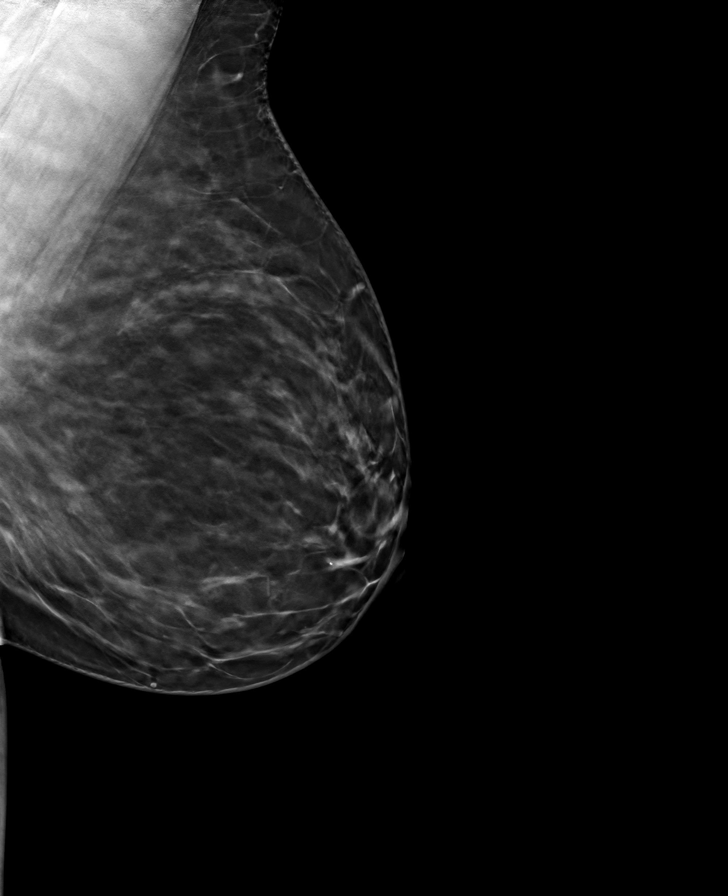

[L CC tomo · tomo slice 41/81.0]
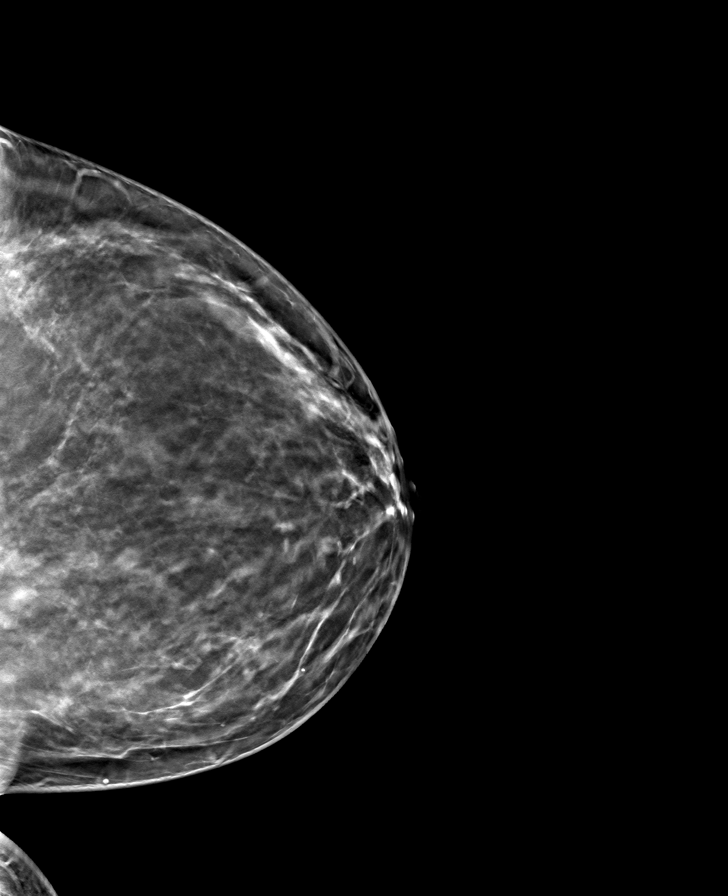

[R CC tomo · tomo slice 41/82.0]
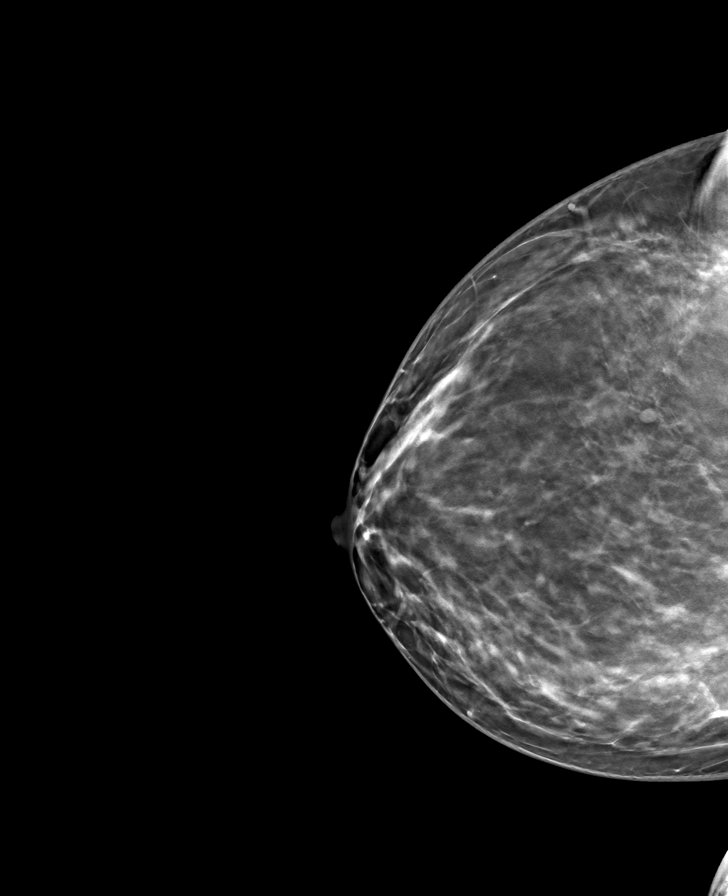

[R MLO tomo · tomo slice 45/90.0]
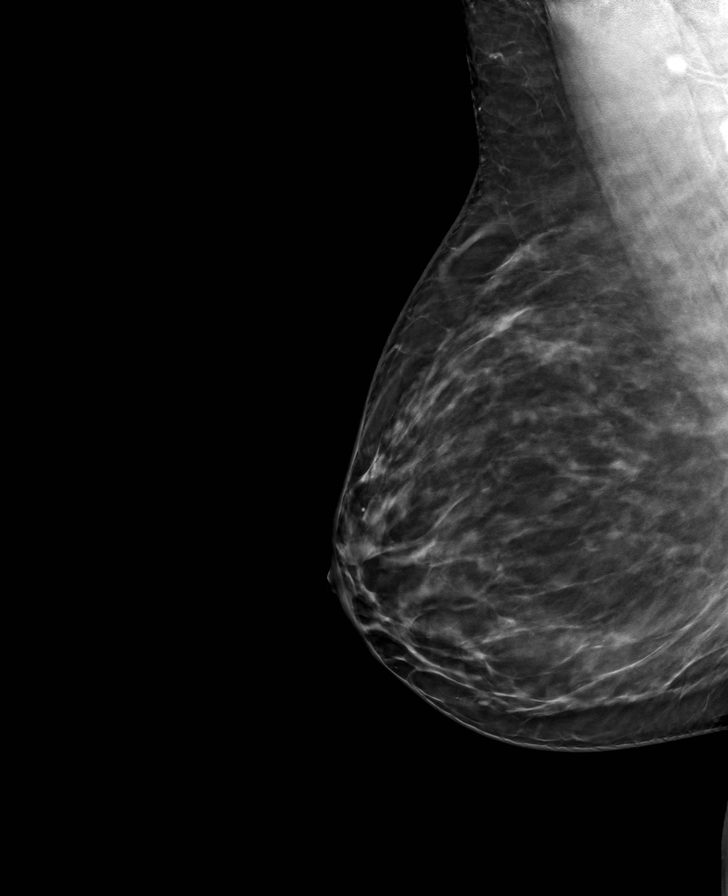

[8 of 24 positions shown; findings below may reference images not displayed]

ACR Breast Density Category b: There are scattered areas of
fibroglandular density.
FINDINGS: There are no findings suspicious for malignancy.
IMPRESSION: No mammographic evidence of malignancy. A result letter of this
screening mammogram will be mailed directly to the patient.

RECOMMENDATION:
Screening mammogram in one year. (Code:51-O-LD2)

BI-RADS CATEGORY  1: Negative.

## 2024-05-07 DIAGNOSIS — F411 Generalized anxiety disorder: Secondary | ICD-10-CM | POA: Diagnosis not present

## 2024-05-15 DIAGNOSIS — F411 Generalized anxiety disorder: Secondary | ICD-10-CM | POA: Diagnosis not present

## 2024-05-24 DIAGNOSIS — F411 Generalized anxiety disorder: Secondary | ICD-10-CM | POA: Diagnosis not present
